# Patient Record
Sex: Female | Born: 1990 | Smoking: Never smoker
Health system: Southern US, Community
[De-identification: ages and names within clinical notes are randomized; demographics above are authoritative.]

---

## 2009-07-09 ENCOUNTER — Emergency Department: Payer: Self-pay | Admitting: Emergency Medicine

## 2016-03-08 ENCOUNTER — Ambulatory Visit: Payer: Self-pay | Attending: Internal Medicine

## 2016-06-16 ENCOUNTER — Emergency Department
Admission: EM | Admit: 2016-06-16 | Discharge: 2016-06-16 | Disposition: A | Discharge status: Home / Self Care | Payer: No Typology Code available for payment source | Attending: Emergency Medicine | Admitting: Emergency Medicine

## 2016-06-16 ENCOUNTER — Encounter: Payer: Self-pay | Admitting: Emergency Medicine

## 2016-06-16 DIAGNOSIS — S8002XA Contusion of left knee, initial encounter: Secondary | ICD-10-CM | POA: Insufficient documentation

## 2016-06-16 DIAGNOSIS — Y9389 Activity, other specified: Secondary | ICD-10-CM | POA: Insufficient documentation

## 2016-06-16 DIAGNOSIS — Y92481 Parking lot as the place of occurrence of the external cause: Secondary | ICD-10-CM | POA: Diagnosis not present

## 2016-06-16 DIAGNOSIS — S6992XA Unspecified injury of left wrist, hand and finger(s), initial encounter: Secondary | ICD-10-CM | POA: Diagnosis present

## 2016-06-16 DIAGNOSIS — S63502A Unspecified sprain of left wrist, initial encounter: Secondary | ICD-10-CM | POA: Diagnosis not present

## 2016-06-16 DIAGNOSIS — Y999 Unspecified external cause status: Secondary | ICD-10-CM | POA: Diagnosis not present

## 2016-06-16 MED ORDER — IBUPROFEN 800 MG PO TABS
800.0000 mg | ORAL_TABLET | Freq: Once | ORAL | Status: AC
  Administered 2016-06-16: 800 mg via ORAL
  Filled 2016-06-16: qty 1

## 2016-06-16 MED ORDER — NAPROXEN 500 MG PO TABS: 500.0000 mg | ORAL_TABLET | Freq: Two times a day (BID) | ORAL | 0 refills | Status: DC
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
## 2016-06-16 NOTE — Discharge Instructions (Signed)
Please take naproxen as needed and were Velcro wrist splint as needed. Ice wrist and hand as needed 20 minutes every hour for the next 2-3 days. Return to the ER for any worsening symptoms urgent changes in her health.

## 2020-08-19 ENCOUNTER — Encounter (HOSPITAL_COMMUNITY): Payer: Self-pay | Admitting: Emergency Medicine

## 2020-08-19 ENCOUNTER — Other Ambulatory Visit: Payer: Self-pay

## 2020-08-19 ENCOUNTER — Ambulatory Visit (HOSPITAL_COMMUNITY)
Admission: EM | Admit: 2020-08-19 | Discharge: 2020-08-19 | Disposition: A | Discharge status: Home / Self Care | Payer: Self-pay | Attending: Student | Admitting: Student

## 2020-08-19 ENCOUNTER — Ambulatory Visit (INDEPENDENT_AMBULATORY_CARE_PROVIDER_SITE_OTHER): Payer: Self-pay

## 2020-08-19 DIAGNOSIS — R509 Fever, unspecified: Secondary | ICD-10-CM

## 2020-08-19 DIAGNOSIS — Z20822 Contact with and (suspected) exposure to covid-19: Secondary | ICD-10-CM

## 2020-08-19 DIAGNOSIS — R059 Cough, unspecified: Secondary | ICD-10-CM

## 2020-08-19 DIAGNOSIS — J209 Acute bronchitis, unspecified: Secondary | ICD-10-CM

## 2020-08-19 DIAGNOSIS — R0682 Tachypnea, not elsewhere classified: Secondary | ICD-10-CM

## 2020-08-19 DIAGNOSIS — R000 Tachycardia, unspecified: Secondary | ICD-10-CM

## 2020-08-19 MED ORDER — BENZONATATE 100 MG PO CAPS: 100.0000 mg | ORAL_CAPSULE | Freq: Three times a day (TID) | ORAL | 0 refills | Status: DC

## 2020-08-19 MED ORDER — PREDNISONE 20 MG PO TABS: 20.0000 mg | ORAL_TABLET | Freq: Every day | ORAL | 0 refills | Status: AC

## 2020-08-19 MED ORDER — ACETAMINOPHEN 325 MG PO TABS
650.0000 mg | ORAL_TABLET | Freq: Once | ORAL | Status: AC
  Administered 2020-08-19: 650 mg via ORAL

## 2020-08-19 MED ORDER — PROMETHAZINE-DM 6.25-15 MG/5ML PO SYRP: 5.0000 mL | ORAL_SOLUTION | Freq: Four times a day (QID) | ORAL | 0 refills | Status: DC | PRN

## 2020-08-19 MED ORDER — PREDNISONE 20 MG PO TABS: 20.0000 mg | ORAL_TABLET | Freq: Every day | ORAL | 0 refills | Status: DC

## 2020-08-19 MED ORDER — PREDNISONE 20 MG PO TABS: 40.0000 mg | ORAL_TABLET | Freq: Every day | ORAL | 0 refills | Status: DC

## 2020-08-19 MED ORDER — ACETAMINOPHEN 325 MG PO TABS
ORAL_TABLET | ORAL | Status: AC
  Filled 2020-08-19: qty 2

## 2020-08-19 NOTE — ED Provider Notes (Signed)
[facility]    CSN: [id number] Arrival date & time: 08/19/20  1407      History   Chief Complaint Chief Complaint  Patient presents with  . Cough    HPI Crystal Hicks is a 30 y.o. female Presenting for URI symptoms for 9 days, following positive exposure to covid at home. States she had negative covid test on day 1 of symptoms. Lives with someone who tested positive for covid. Endorses frequent cough. Initially with diarrhea but no longer. Denies fevers/chills, n/v/d, shortness of breath, chest pain, facial pain, teeth pain, headaches, sore throat, loss of taste/smell, swollen lymph nodes, ear pain. Denies chest pain, shortness of breath, confusion, high fevers.    HPI  History reviewed. No pertinent past medical history.  There are no problems to display for this patient.   History reviewed. No pertinent surgical history.  OB History   No obstetric history on file.      Home Medications    Prior to Admission medications   Medication Sig Start Date End Date Taking? Authorizing Provider  benzonatate (TESSALON) 100 MG capsule Take 1 capsule (100 mg total) by mouth every 8 (eight) hours. 08/19/20  Yes Rhys Martini, PA-C  promethazine-dextromethorphan (PROMETHAZINE-DM) 6.25-15 MG/5ML syrup Take 5 mLs by mouth 4 (four) times daily as needed for cough. 08/19/20  Yes Rhys Martini, PA-C  naproxen (NAPROSYN) 500 MG tablet Take 1 tablet (500 mg total) by mouth 2 (two) times daily with a meal. 06/16/16   Evon Slack, PA-C  predniSONE (DELTASONE) 20 MG tablet Take 1 tablet (20 mg total) by mouth daily for 5 days. 08/19/20 08/24/20  Rhys Martini, PA-C    Family History History reviewed. No pertinent family history.  Social History Social History   Tobacco Use  . Smoking status: Never Smoker  . Smokeless tobacco: Never Used  Substance Use Topics  . Alcohol use: No     Allergies   Patient has no known allergies.   Review of Systems Review of  Systems  Constitutional: Negative for appetite change, chills and fever.  HENT: Negative for congestion, ear pain, rhinorrhea, sinus pressure, sinus pain and sore throat.   Eyes: Negative for redness and visual disturbance.  Respiratory: Positive for cough. Negative for chest tightness, shortness of breath and wheezing.   Cardiovascular: Negative for chest pain and palpitations.  Gastrointestinal: Negative for abdominal pain, constipation, diarrhea, nausea and vomiting.  Genitourinary: Negative for dysuria, frequency and urgency.  Musculoskeletal: Negative for myalgias.  Neurological: Negative for dizziness, weakness and headaches.  Psychiatric/Behavioral: Negative for confusion.  All other systems reviewed and are negative.    Physical Exam Triage Vital Signs ED Triage Vitals  Enc Vitals Group     BP      Pulse      Resp      Temp      Temp src      SpO2      Weight      Height      Head Circumference      Peak Flow      Pain Score      Pain Loc      Pain Edu?      Excl. in GC?    No data found.  Updated Vital Signs BP 135/75 (BP Location: Right Wrist)   Pulse (!) 107   Temp (!) 101.3 F (38.5 C) (Oral)   Resp (!) 24   LMP 07/28/2020   SpO2  100%   Visual Acuity Right Eye Distance:   Left Eye Distance:   Bilateral Distance:    Right Eye Near:   Left Eye Near:    Bilateral Near:     Physical Exam Vitals reviewed.  Constitutional:      General: She is not in acute distress.    Appearance: Normal appearance. She is not ill-appearing.  HENT:     Head: Normocephalic and atraumatic.     Right Ear: Hearing, tympanic membrane, ear canal and external ear normal. No swelling or tenderness. There is no impacted cerumen. No mastoid tenderness. Tympanic membrane is not perforated, erythematous, retracted or bulging.     Left Ear: Hearing, tympanic membrane, ear canal and external ear normal. No swelling or tenderness. There is no impacted cerumen. No mastoid  tenderness. Tympanic membrane is not perforated, erythematous, retracted or bulging.     Nose:     Right Sinus: No maxillary sinus tenderness or frontal sinus tenderness.     Left Sinus: No maxillary sinus tenderness or frontal sinus tenderness.     Mouth/Throat:     Mouth: Mucous membranes are moist.     Pharynx: Uvula midline. No oropharyngeal exudate or posterior oropharyngeal erythema.     Tonsils: No tonsillar exudate.  Cardiovascular:     Rate and Rhythm: Normal rate and regular rhythm.     Heart sounds: Normal heart sounds.  Pulmonary:     Breath sounds: Normal air entry. Decreased breath sounds present. No wheezing, rhonchi or rales.     Comments: Frequently coughing  Chest:     Chest wall: No tenderness.  Abdominal:     General: Abdomen is flat. Bowel sounds are normal.     Tenderness: There is no abdominal tenderness. There is no guarding or rebound. Negative signs include Murphy's sign, Rovsing's sign and McBurney's sign.  Lymphadenopathy:     Cervical: No cervical adenopathy.  Neurological:     General: No focal deficit present.     Mental Status: She is alert and oriented to person, place, and time.  Psychiatric:        Attention and Perception: Attention and perception normal.        Mood and Affect: Mood and affect normal.        Behavior: Behavior normal. Behavior is cooperative.        Thought Content: Thought content normal.        Judgment: Judgment normal.      UC Treatments / Results  Labs (all labs ordered are listed, but only abnormal results are displayed) Labs Reviewed - No data to display  EKG   Radiology DG Chest 2 View  Result Date: 08/19/2020 CLINICAL DATA:  Cough, fever, tachycardia, tachypnea, COVID-19 exposure EXAM: CHEST - 2 VIEW COMPARISON:  None. FINDINGS: Frontal and lateral views of the chest demonstrate an unremarkable cardiac silhouette. Lung volumes are diminished with crowding of the central vasculature. No airspace disease,  effusion, or pneumothorax. No acute bony abnormalities. IMPRESSION: 1. Low lung volumes.  No acute airspace disease. Electronically Signed   By: Sharlet Salina M.D.   On: 08/19/2020 15:34    Procedures Procedures (including critical care time)  Medications Ordered in UC Medications  acetaminophen (TYLENOL) tablet 650 mg (650 mg Oral Given 08/19/20 1454)    Initial Impression / Assessment and Plan / UC Course  I have reviewed the triage vital signs and the nursing notes.  Pertinent labs & imaging results that were available during my care of  the patient were reviewed by me and considered in my medical decision making (see chart for details).     Discussed that given positive exposure to covid19 and her covid-like symptoms, she most likely has covid. Since today is day 9 of symptoms, pt declines additional covid testing today.   CXR with 1. Low lung volumes.  No acute airspace disease.  Plan to treat for bronchitis as below.   Final Clinical Impressions(s) / UC Diagnoses   Final diagnoses:  Exposure to confirmed case of COVID-19  Acute bronchitis, unspecified organism  Suspected COVID-19 virus infection     Discharge Instructions     -Prednisone 20mg  (1 pill) daily for 5 days -Tessalon as needed for cough. Take one pill up to 3x daily (every 8 hours) -Promethazine DM cough syrup for congestion/cough. This could make you drowsy, so take at night before bed. -For fevers/chills, body aches, headaches- use Tylenol and Ibuprofen. You can alternate these for maximum effect. Use up to 3000mg  Tylenol daily and 3200mg  Ibuprofen daily. Make sure to take ibuprofen with food. Check the bottle of ibuprofen/tylenol for specific dosage instructions.     ED Prescriptions    Medication Sig Dispense Auth. Provider   predniSONE (DELTASONE) 20 MG tablet  (Status: Discontinued) Take 2 tablets (40 mg total) by mouth daily for 5 days. 10 tablet , PA-C   benzonatate (TESSALON) 100  MG capsule Take 1 capsule (100 mg total) by mouth every 8 (eight) hours. 21 capsule , PA-C   promethazine-dextromethorphan (PROMETHAZINE-DM) 6.25-15 MG/5ML syrup Take 5 mLs by mouth 4 (four) times daily as needed for cough. 118 mL Rhys Martini, PA-C   predniSONE (DELTASONE) 20 MG tablet  (Status: Discontinued) Take 1 tablet (20 mg total) by mouth daily for 5 days. 5 tablet Rhys Martini, PA-C   predniSONE (DELTASONE) 20 MG tablet Take 1 tablet (20 mg total) by mouth daily for 5 days. 5 tablet [birth year]1990, PA-C     PDMP not reviewed this encounter.   Rhys Martini, PA-C 08/19/20 [phone]

## 2020-08-19 NOTE — Discharge Instructions (Addendum)
-  Prednisone 20mg  (1 pill) daily for 5 days -Tessalon as needed for cough. Take one pill up to 3x daily (every 8 hours) -Promethazine DM cough syrup for congestion/cough. This could make you drowsy, so take at night before bed. -For fevers/chills, body aches, headaches- use Tylenol and Ibuprofen. You can alternate these for maximum effect. Use up to 3000mg  Tylenol daily and 3200mg  Ibuprofen daily. Make sure to take ibuprofen with food. Check the bottle of ibuprofen/tylenol for specific dosage instructions.

## 2020-08-19 NOTE — ED Triage Notes (Signed)
Pt presents with cough and SOB xs 1 week. States was tested for COVID last week and tested negative.

## 2020-09-01 ENCOUNTER — Other Ambulatory Visit: Payer: Self-pay

## 2020-09-01 ENCOUNTER — Observation Stay
Admission: EM | Admit: 2020-09-01 | Discharge: 2020-09-03 | Disposition: A | Discharge status: Home / Self Care | Payer: Self-pay | Attending: Internal Medicine | Admitting: Internal Medicine

## 2020-09-01 ENCOUNTER — Emergency Department: Payer: Self-pay

## 2020-09-01 DIAGNOSIS — R079 Chest pain, unspecified: Secondary | ICD-10-CM

## 2020-09-01 DIAGNOSIS — Z79899 Other long term (current) drug therapy: Secondary | ICD-10-CM | POA: Insufficient documentation

## 2020-09-01 DIAGNOSIS — I824Y3 Acute embolism and thrombosis of unspecified deep veins of proximal lower extremity, bilateral: Principal | ICD-10-CM | POA: Insufficient documentation

## 2020-09-01 DIAGNOSIS — Z20822 Contact with and (suspected) exposure to covid-19: Secondary | ICD-10-CM | POA: Insufficient documentation

## 2020-09-01 DIAGNOSIS — I824Y1 Acute embolism and thrombosis of unspecified deep veins of right proximal lower extremity: Secondary | ICD-10-CM

## 2020-09-01 DIAGNOSIS — I2699 Other pulmonary embolism without acute cor pulmonale: Secondary | ICD-10-CM

## 2020-09-01 LAB — BASIC METABOLIC PANEL
Anion gap: 7 (ref 5–15)
BUN: 15 mg/dL (ref 6–20)
CO2: 25 mmol/L (ref 22–32)
Calcium: 9.1 mg/dL (ref 8.9–10.3)
Chloride: 107 mmol/L (ref 98–111)
Creatinine, Ser: 0.85 mg/dL (ref 0.44–1.00)
GFR, Estimated: >60 mL/min (ref >60)
Glucose, Bld: 95 mg/dL (ref 70–99)
Potassium: 3.9 mmol/L (ref 3.5–5.1)
Sodium: 139 mmol/L (ref 135–145)

## 2020-09-01 LAB — CBC WITH DIFFERENTIAL/PLATELET
Abs Immature Granulocytes: 0.04 10*3/uL (ref 0.00–0.07)
Basophils Absolute: 0 10*3/uL (ref 0.0–0.1)
Basophils Relative: 0 %
Eosinophils Absolute: 0 10*3/uL (ref 0.0–0.5)
Eosinophils Relative: 0 %
HCT: 37.6 % (ref 36.0–46.0)
Hemoglobin: 12.6 g/dL (ref 12.0–15.0)
Immature Granulocytes: 0 %
Lymphocytes Relative: 18 %
Lymphs Abs: 1.8 10*3/uL (ref 0.7–4.0)
MCH: 28.5 pg (ref 26.0–34.0)
MCHC: 33.5 g/dL (ref 30.0–36.0)
MCV: 85.1 fL (ref 80.0–100.0)
Monocytes Absolute: 0.7 10*3/uL (ref 0.1–1.0)
Monocytes Relative: 7 %
Neutro Abs: 7.4 10*3/uL (ref 1.7–7.7)
Neutrophils Relative %: 75 %
Platelets: 240 10*3/uL (ref 150–400)
RBC: 4.42 MIL/uL (ref 3.87–5.11)
RDW: 13 % (ref 11.5–15.5)
WBC: 9.9 10*3/uL (ref 4.0–10.5)
nRBC: 0 % (ref 0.0–0.2)

## 2020-09-01 LAB — POC URINE PREG, ED: Preg Test, Ur: NEGATIVE

## 2020-09-01 MED ORDER — SODIUM CHLORIDE 0.9 % IV BOLUS
500.0000 mL | Freq: Once | INTRAVENOUS | Status: AC
  Administered 2020-09-01: 500 mL via INTRAVENOUS

## 2020-09-01 MED ORDER — IOHEXOL 350 MG/ML SOLN
100.0000 mL | Freq: Once | INTRAVENOUS | Status: AC | PRN
  Administered 2020-09-01: 100 mL via INTRAVENOUS

## 2020-09-01 NOTE — ED Notes (Signed)
Pt refusing covid swab, Sung MD made aware.

## 2020-09-01 NOTE — ED Notes (Signed)
Pt refuses wheelchair at this time

## 2020-09-01 NOTE — ED Provider Notes (Incomplete)
[facility] Emergency Department Provider Note   ____________________________________________   Event Date/Time   First MD Initiated Contact with Patient 09/01/20 2306     (approximate)  I have reviewed the triage vital signs and the nursing notes.   HISTORY  Chief Complaint Cough and Leg Pain    HPI Crystal Hicks is a 30 y.o. female referred to the ED from [facility] clinic for evaluation of right calf pain and cough.  Patient reports right calf pain x1 week.  Denies injury, travel, smoking, OCP use.  Prior to that patient has had dry cough x3 weeks.  She had a negative Covid test and placed on azithromycin, cough medicine and albuterol inhaler.  Patient believes that her calf pain was caused by her medications.  Has also had intermittent chest pain throughout this time. Denies fever, shortness of breath, abdominal pain, nausea, vomiting or dizziness.  Patient is not vaccinated against COVID-19.     Past medical history None  There are no problems to display for this patient.   No past surgical history on file.  Prior to Admission medications   Medication Sig Start Date End Date Taking? Authorizing Provider  benzonatate (TESSALON) 100 MG capsule Take 1 capsule (100 mg total) by mouth every 8 (eight) hours. 08/19/20   Rhys Martini, PA-C  naproxen (NAPROSYN) 500 MG tablet Take 1 tablet (500 mg total) by mouth 2 (two) times daily with a meal. 06/16/16   Evon Slack, PA-C  promethazine-dextromethorphan (PROMETHAZINE-DM) 6.25-15 MG/5ML syrup Take 5 mLs by mouth 4 (four) times daily as needed for cough. 08/19/20   Rhys Martini, PA-C    Allergies Patient has no known allergies.  No family history on file.  Social History Social History   Tobacco Use  . Smoking status: Never Smoker  . Smokeless tobacco: Never Used  Substance Use Topics  . Alcohol use: No    Review of Systems  Constitutional: No fever/chills Eyes: No visual  changes. ENT: No sore throat. Cardiovascular: Positive for chest pain. Respiratory: Positive for cough.  Denies shortness of breath. Gastrointestinal: No abdominal pain.  No nausea, no vomiting.  No diarrhea.  No constipation. Genitourinary: Negative for dysuria. Musculoskeletal: Positive for right calf pain.  Negative for back pain. Skin: Negative for rash. Neurological: Negative for headaches, focal weakness or numbness.   ____________________________________________   PHYSICAL EXAM:  VITAL SIGNS: ED Triage Vitals  Enc Vitals Group     BP 09/01/20 1929 (!) 164/85     Pulse Rate 09/01/20 1929 100     Resp 09/01/20 1929 20     Temp 09/01/20 1929 98.3 F (36.8 C)     Temp Source 09/01/20 1929 Oral     SpO2 09/01/20 1929 100 %     Weight 09/01/20 1930 (!) 370 lb (167.8 kg)     Height 09/01/20 1930 5\' 5"  (1.651 m)     Head Circumference --      Peak Flow --      Pain Score 09/01/20 1930 6     Pain Loc --      Pain Edu? --      Excl. in GC? --     Constitutional: Alert and oriented. Well appearing and in no acute distress. Eyes: Conjunctivae are normal. PERRL. EOMI. Head: Atraumatic. Nose: No congestion/rhinnorhea. Mouth/Throat: Mucous membranes are moist.   Neck: No stridor.   Cardiovascular: Normal rate, regular rhythm. Grossly normal heart sounds.  Good peripheral circulation. Respiratory: Dry  cough noted.  Normal respiratory effort.  No retractions. Lungs CTAB. Gastrointestinal: Obese.  Soft and nontender. No distention. No abdominal bruits. No CVA tenderness. Musculoskeletal: Right calf tenderness to palpation.  No pedal edema.  2+ distal pulses.  Brisk, less than 5-second capillary refill.  No joint effusions. Neurologic:  Normal speech and language. No gross focal neurologic deficits are appreciated.  Skin:  Skin is warm, dry and intact. No rash noted. Psychiatric: Mood and affect are normal. Speech and behavior are  normal.  ____________________________________________   LABS (all labs ordered are listed, but only abnormal results are displayed)  Labs Reviewed  SARS CORONAVIRUS 2 BY RT PCR (HOSPITAL ORDER, PERFORMED IN [place] [facility])  CBC WITH DIFFERENTIAL/PLATELET  BASIC METABOLIC PANEL  HCG, QUANTITATIVE, PREGNANCY  POC URINE PREG, ED  TROPONIN I (HIGH SENSITIVITY)   ____________________________________________  EKG  ED ECG REPORT I, SUNG,JADE J, the attending physician, personally viewed and interpreted this ECG.   Date: 09/01/2020  EKG Time: 2329  Rate: 104  Rhythm: sinus tachycardia  Axis: Normal  Intervals:none  ST&T Change: Nonspecific  ____________________________________________  RADIOLOGY I, SUNG,JADE J, personally viewed and evaluated these images (plain radiographs) as part of my medical decision making, as well as reviewing the written report by the radiologist.  ED MD interpretation: Right DVT;  Official radiology report(s): US Venous Img Lower Unilateral Right  Addendum Date: 09/01/2020   ADDENDUM REPORT: 09/01/2020 20:38 ADDENDUM: These results were called by telephone at the time of interpretation on 09/01/2020 at 8:38 pm to provider Shaune Pollack , who verbally acknowledged these results. Electronically Signed   By: Kreg Shropshire M.D.   On: 09/01/2020 20:38   Result Date: 09/01/2020 CLINICAL DATA:  Right calf pain for 1 week EXAM: RIGHT LOWER EXTREMITY VENOUS DOPPLER ULTRASOUND TECHNIQUE: Gray-scale sonography with compression, as well as color and duplex ultrasound, were performed to evaluate the deep venous system(s) from the level of the common femoral vein through the popliteal and proximal calf veins. COMPARISON:  None. FINDINGS: VENOUS Incompletely occlusive, noncompressible thrombus is seen in the popliteal and 1 of 2 posterior tibial veins. Occlusive, noncompressible thrombus is present in in both peroneal veins of the right lower extremity. Normal  compressibility of the common femoral, and superficial femoral veins. Visualized portions of profunda femoral vein and great saphenous vein unremarkable. No other filling defects to suggest additional DVT on grayscale or color Doppler imaging. Doppler waveforms show normal direction of venous flow, normal respiratory plasticity and response to augmentation. Limited views of the contralateral common femoral vein are unremarkable. OTHER None. Limitations: none IMPRESSION: Partially occlusive thrombus in the popliteal vein and a single posterior tibial vein as well as occlusive thrombus in both peroneal veins. Electronically Signed: By: Kreg Shropshire M.D. On: 09/01/2020 20:33    ____________________________________________   PROCEDURES  Procedure(s) performed (including Critical Care):  .1-3 Lead EKG Interpretation Performed by: Irean Hong, MD Authorized by: Irean Hong, MD     Interpretation: abnormal     ECG rate:  106   ECG rate assessment: tachycardic     Rhythm: sinus tachycardia     Ectopy: none     Conduction: normal   Comments:     Patient placed on cardiac monitor to evaluate for arrhythmias     ____________________________________________   INITIAL IMPRESSION / ASSESSMENT AND PLAN / ED COURSE  As part of my medical decision making, I reviewed the following data within the electronic MEDICAL RECORD NUMBER Nursing notes reviewed  and incorporated, Labs reviewed, EKG interpreted, Old chart reviewed, Radiograph reviewed and Notes from prior ED visits     30 year old female sent from [facility] clinic for evaluation of right calf pain and cough with chest pain. Differential diagnosis includes, but is not limited to, ACS, aortic dissection, pulmonary embolism, cardiac tamponade, pneumothorax, pneumonia, pericarditis, myocarditis, GI-related causes including esophagitis/gastritis, and musculoskeletal chest wall pain.    Right lower leg ultrasound is positive for DVT.  Will check EKG,  troponin, CTA chest to evaluate for PE.  Obtain Covid swab.      ____________________________________________   FINAL CLINICAL IMPRESSION(S) / ED DIAGNOSES  Final diagnoses:  Acute deep vein thrombosis (DVT) of proximal vein of right lower extremity [facility])     ED Discharge Orders    None      *Please note:  Crystal Hicks was evaluated in Emergency Department on 09/01/2020 for the symptoms described in the history of present illness. She was evaluated in the context of the global COVID-19 pandemic, which necessitated consideration that the patient might be at risk for infection with the SARS-CoV-2 virus that causes COVID-19. Institutional protocols and algorithms that pertain to the evaluation of patients at risk for COVID-19 are in a state of rapid change based on information released by regulatory bodies including the CDC and federal and state organizations. These policies and algorithms were followed during the patient's care in the ED.  Some ED evaluations and interventions may be delayed as a result of limited staffing during and the pandemic.*   Note:  This document was prepared using Dragon voice recognition software and may include unintentional dictation errors.

## 2020-09-01 NOTE — ED Provider Notes (Signed)
[facility] Emergency Department Provider Note   ____________________________________________   Event Date/Time   First MD Initiated Contact with Patient 09/01/20 2306     (approximate)  I have reviewed the triage vital signs and the nursing notes.   HISTORY  Chief Complaint Cough and Leg Pain    HPI Crystal Hicks is a 30 y.o. female referred to the ED from [facility] clinic for evaluation of right calf pain and cough.  Patient reports right calf pain x1 week.  Denies injury, travel, smoking, OCP use.  Prior to that patient has had dry cough x3 weeks.  She had a negative Covid test and placed on azithromycin, cough medicine and albuterol inhaler.  Patient believes that her calf pain was caused by her medications.  Has also had intermittent chest pain throughout this time. Denies fever, shortness of breath, abdominal pain, nausea, vomiting or dizziness.  Patient is not vaccinated against COVID-19.     Past medical history None  There are no problems to display for this patient.   No past surgical history on file.  Prior to Admission medications   Medication Sig Start Date End Date Taking? Authorizing Provider  benzonatate (TESSALON) 100 MG capsule Take 1 capsule (100 mg total) by mouth every 8 (eight) hours. 08/19/20   Rhys Martini, PA-C  naproxen (NAPROSYN) 500 MG tablet Take 1 tablet (500 mg total) by mouth 2 (two) times daily with a meal. 06/16/16   Evon Slack, PA-C  promethazine-dextromethorphan (PROMETHAZINE-DM) 6.25-15 MG/5ML syrup Take 5 mLs by mouth 4 (four) times daily as needed for cough. 08/19/20   Rhys Martini, PA-C    Allergies Patient has no known allergies.  No family history on file.  Social History Social History   Tobacco Use  . Smoking status: Never Smoker  . Smokeless tobacco: Never Used  Substance Use Topics  . Alcohol use: No    Review of Systems  Constitutional: No fever/chills Eyes: No visual  changes. ENT: No sore throat. Cardiovascular: Positive for chest pain. Respiratory: Positive for cough.  Denies shortness of breath. Gastrointestinal: No abdominal pain.  No nausea, no vomiting.  No diarrhea.  No constipation. Genitourinary: Negative for dysuria. Musculoskeletal: Positive for right calf pain.  Negative for back pain. Skin: Negative for rash. Neurological: Negative for headaches, focal weakness or numbness.   ____________________________________________   PHYSICAL EXAM:  VITAL SIGNS: ED Triage Vitals  Enc Vitals Group     BP 09/01/20 1929 (!) 164/85     Pulse Rate 09/01/20 1929 100     Resp 09/01/20 1929 20     Temp 09/01/20 1929 98.3 F (36.8 C)     Temp Source 09/01/20 1929 Oral     SpO2 09/01/20 1929 100 %     Weight 09/01/20 1930 (!) 370 lb (167.8 kg)     Height 09/01/20 1930 5\' 5"  (1.651 m)     Head Circumference --      Peak Flow --      Pain Score 09/01/20 1930 6     Pain Loc --      Pain Edu? --      Excl. in GC? --     Constitutional: Alert and oriented. Well appearing and in no acute distress. Eyes: Conjunctivae are normal. PERRL. EOMI. Head: Atraumatic. Nose: No congestion/rhinnorhea. Mouth/Throat: Mucous membranes are moist.   Neck: No stridor.   Cardiovascular: Normal rate, regular rhythm. Grossly normal heart sounds.  Good peripheral circulation. Respiratory: Dry  cough noted.  Normal respiratory effort.  No retractions. Lungs CTAB. Gastrointestinal: Obese.  Soft and nontender. No distention. No abdominal bruits. No CVA tenderness. Musculoskeletal: Right calf tenderness to palpation.  No pedal edema.  2+ distal pulses.  Brisk, less than 5-second capillary refill.  No joint effusions. Neurologic:  Normal speech and language. No gross focal neurologic deficits are appreciated.  Skin:  Skin is warm, dry and intact. No rash noted. Psychiatric: Mood and affect are normal. Speech and behavior are  normal.  ____________________________________________   LABS (all labs ordered are listed, but only abnormal results are displayed)  Labs Reviewed  SARS CORONAVIRUS 2 BY RT PCR (HOSPITAL ORDER, PERFORMED IN [place] [facility])  CBC WITH DIFFERENTIAL/PLATELET  BASIC METABOLIC PANEL  POC URINE PREG, ED  TROPONIN I (HIGH SENSITIVITY)   ____________________________________________  EKG  ED ECG REPORT I, Kieran Nachtigal J, the attending physician, personally viewed and interpreted this ECG.   Date: 09/01/2020  EKG Time: 2329  Rate: 104  Rhythm: sinus tachycardia  Axis: Normal  Intervals:none  ST&T Change: Nonspecific  ____________________________________________  RADIOLOGY I, Berish Bohman J, personally viewed and evaluated these images (plain radiographs) as part of my medical decision making, as well as reviewing the written report by the radiologist.  ED MD interpretation: Right DVT; pulmonary embolism  Official radiology report(s): CT Angio Chest PE W/Cm &/Or Wo Cm  Result Date: 09/02/2020 CLINICAL DATA:  PE suspected, high probability, positive DVT, chest pain and cough for 3 weeks EXAM: CT ANGIOGRAPHY CHEST WITH CONTRAST TECHNIQUE: Multidetector CT imaging of the chest was performed using the standard protocol during bolus administration of intravenous contrast. Multiplanar CT image reconstructions and MIPs were obtained to evaluate the vascular anatomy. CONTRAST:  OMNIPAQUE IOHEXOL 350 MG/ML SOLN COMPARISON:  Radiograph 08/19/2020 FINDINGS: Cardiovascular: Initial contrast opacification of the pulmonary arteries with suboptimal requiring repeat injection. Repeat contrast bolus opacification is borderline with imaging quality significantly degraded by patient body habitus. However, there are demonstrable distal right central, right upper, middle and lower lobar and smaller segmental filling defects throughout the right lung (18/71, 67, 78). Question artifact versus additional  filling defect in segmental branches of the left lower lobe (18/72). Central pulmonary arteries are normal caliber. RV/LV ratio is maintained at 0.55. Normal heart size. No pericardial effusion. No pericardial effusion. Mediastinum/Nodes: Some soft tissue attenuation in the anterior mediastinum is favored to reflect remnant in the absence trauma. No mediastinal fluid or gas. Normal thyroid gland and thoracic inlet. No acute abnormality of the trachea or esophagus. No worrisome mediastinal, hilar or axillary adenopathy. Lungs/Pleura: Minimal atelectasis. No consolidation, features of edema, pneumothorax, or effusion. No suspicious pulmonary nodules or masses. Upper Abdomen: Diffuse hepatic hypoattenuation compatible with hepatic steatosis. No acute abnormalities present in the visualized portions of the upper abdomen. Musculoskeletal: Mildly age advanced degenerative changes in the spine. No acute osseous abnormality or suspicious osseous lesion. No acute or conspicuous chest wall lesions. Review of the MIP images confirms the above findings. IMPRESSION: 1. Exam quality degraded by borderline opacification and patient body habitus. 2. Demonstrable distal right central, right upper, middle and lower lobar and smaller segmental filling defects throughout the right lung. Question artifact versus additional filling defect in segmental branches of the left lower lobe. No evidence of right heart strain. 3. Hepatic steatosis. These results were called by telephone at the time of interpretation on 09/02/2020 at 12:10 am to provider Posey Petrik , who verbally acknowledged these results. Electronically Signed   By: Samuella Cota  [facility] M.D.   On: 09/02/2020 00:10   US Venous Img Lower Unilateral Right  Addendum Date: 09/01/2020   ADDENDUM REPORT: 09/01/2020 20:38 ADDENDUM: These results were called by telephone at the time of interpretation on 09/01/2020 at 8:38 pm to provider Shaune Pollack , who verbally acknowledged these results.  Electronically Signed   By: Kreg Shropshire M.D.   On: 09/01/2020 20:38   Result Date: 09/01/2020 CLINICAL DATA:  Right calf pain for 1 week EXAM: RIGHT LOWER EXTREMITY VENOUS DOPPLER ULTRASOUND TECHNIQUE: Gray-scale sonography with compression, as well as color and duplex ultrasound, were performed to evaluate the deep venous system(s) from the level of the common femoral vein through the popliteal and proximal calf veins. COMPARISON:  None. FINDINGS: VENOUS Incompletely occlusive, noncompressible thrombus is seen in the popliteal and 1 of 2 posterior tibial veins. Occlusive, noncompressible thrombus is present in in both peroneal veins of the right lower extremity. Normal compressibility of the common femoral, and superficial femoral veins. Visualized portions of profunda femoral vein and great saphenous vein unremarkable. No other filling defects to suggest additional DVT on grayscale or color Doppler imaging. Doppler waveforms show normal direction of venous flow, normal respiratory plasticity and response to augmentation. Limited views of the contralateral common femoral vein are unremarkable. OTHER None. Limitations: none IMPRESSION: Partially occlusive thrombus in the popliteal vein and a single posterior tibial vein as well as occlusive thrombus in both peroneal veins. Electronically Signed: By: Kreg Shropshire M.D. On: 09/01/2020 20:33    ____________________________________________   PROCEDURES  Procedure(s) performed (including Critical Care):  .1-3 Lead EKG Interpretation Performed by: Irean Hong, MD Authorized by: Irean Hong, MD     Interpretation: abnormal     ECG rate:  106   ECG rate assessment: tachycardic     Rhythm: sinus tachycardia     Ectopy: none     Conduction: normal   Comments:     Patient placed on cardiac monitor to evaluate for arrhythmias    CRITICAL CARE Performed by: Irean Hong   Total critical care time: 30 minutes  Critical care time was exclusive of  separately billable procedures and treating other patients.  Critical care was necessary to treat or prevent imminent or life-threatening deterioration.  Critical care was time spent personally by me on the following activities: development of treatment plan with patient and/or surrogate as well as nursing, discussions with consultants, evaluation of patient's response to treatment, examination of patient, obtaining history from patient or surrogate, ordering and performing treatments and interventions, ordering and review of laboratory studies, ordering and review of radiographic studies, pulse oximetry and re-evaluation of patient's condition.  ____________________________________________   INITIAL IMPRESSION / ASSESSMENT AND PLAN / ED COURSE  As part of my medical decision making, I reviewed the following data within the electronic MEDICAL RECORD NUMBER Nursing notes reviewed and incorporated, Labs reviewed, EKG interpreted, Old chart reviewed, Radiograph reviewed and Notes from prior ED visits     30 year old female sent from [facility] clinic for evaluation of right calf pain and cough with chest pain. Differential diagnosis includes, but is not limited to, ACS, aortic dissection, pulmonary embolism, cardiac tamponade, pneumothorax, pneumonia, pericarditis, myocarditis, GI-related causes including esophagitis/gastritis, and musculoskeletal chest wall pain.    Right lower leg ultrasound is positive for DVT.  Will check EKG, troponin, CTA chest to evaluate for PE.  Obtain Covid swab.  Clinical Course as of 09/02/20 0017  Mon Sep 01, 2020  2320 Spoke with radiology regarding patient's  CT scan positive for pulmonary embolism.  Updated patient who is agreeable with hospitalization.  Will initiate heparin bolus with drip and discussed case with hospitalist services for admission.  Of note, patient refused COVID-19 swab. [JS]    Clinical Course User Index [JS] Irean Hong, MD      ____________________________________________   FINAL CLINICAL IMPRESSION(S) / ED DIAGNOSES  Final diagnoses:  Acute deep vein thrombosis (DVT) of proximal vein of right lower extremity (HCC)  Other acute pulmonary embolism, unspecified whether acute cor pulmonale present (HCC)  Chest pain, unspecified type     ED Discharge Orders    None      *Please note:  JAMMY STLOUIS was evaluated in Emergency Department on 09/02/2020 for the symptoms described in the history of present illness. She was evaluated in the context of the global COVID-19 pandemic, which necessitated consideration that the patient might be at risk for infection with the SARS-CoV-2 virus that causes COVID-19. Institutional protocols and algorithms that pertain to the evaluation of patients at risk for COVID-19 are in a state of rapid change based on information released by regulatory bodies including the CDC and federal and state organizations. These policies and algorithms were followed during the patient's care in the ED.  Some ED evaluations and interventions may be delayed as a result of limited staffing during and the pandemic.*   Note:  This document was prepared using Dragon voice recognition software and may include unintentional dictation errors.   Irean Hong, MD 09/02/20 [phone]

## 2020-09-01 NOTE — ED Triage Notes (Signed)
Pt in with co cough states went to [facility] acute care and was sent here to rule out DVT due to also having right calf pain. Pt has had cough x 3 weeks, and right calf pain for 1 week.

## 2020-09-02 ENCOUNTER — Observation Stay (HOSPITAL_BASED_OUTPATIENT_CLINIC_OR_DEPARTMENT_OTHER)
Admit: 2020-09-02 | Discharge: 2020-09-02 | Disposition: A | Discharge status: Home / Self Care | Payer: Self-pay | Attending: Internal Medicine | Admitting: Internal Medicine

## 2020-09-02 ENCOUNTER — Encounter: Payer: Self-pay | Admitting: Internal Medicine

## 2020-09-02 DIAGNOSIS — I824Y1 Acute embolism and thrombosis of unspecified deep veins of right proximal lower extremity: Secondary | ICD-10-CM

## 2020-09-02 DIAGNOSIS — R079 Chest pain, unspecified: Secondary | ICD-10-CM

## 2020-09-02 DIAGNOSIS — I2699 Other pulmonary embolism without acute cor pulmonale: Secondary | ICD-10-CM

## 2020-09-02 LAB — ECHOCARDIOGRAM COMPLETE
Height: 65 in
S' Lateral: 2.26 cm
Weight: 5920 oz

## 2020-09-02 LAB — BASIC METABOLIC PANEL
Anion gap: 9 (ref 5–15)
BUN: 12 mg/dL (ref 6–20)
CO2: 25 mmol/L (ref 22–32)
Calcium: 8.8 mg/dL — ABNORMAL LOW (ref 8.9–10.3)
Chloride: 105 mmol/L (ref 98–111)
Creatinine, Ser: 0.81 mg/dL (ref 0.44–1.00)
GFR, Estimated: >60 mL/min (ref >60)
Glucose, Bld: 115 mg/dL — ABNORMAL HIGH (ref 70–99)
Potassium: 3.7 mmol/L (ref 3.5–5.1)
Sodium: 139 mmol/L (ref 135–145)

## 2020-09-02 LAB — HIV ANTIBODY (ROUTINE TESTING W REFLEX): HIV Screen 4th Generation wRfx: NONREACTIVE

## 2020-09-02 LAB — CBC
HCT: 36.1 % (ref 36.0–46.0)
Hemoglobin: 12.4 g/dL (ref 12.0–15.0)
MCH: 29.2 pg (ref 26.0–34.0)
MCHC: 34.3 g/dL (ref 30.0–36.0)
MCV: 85.1 fL (ref 80.0–100.0)
Platelets: 237 10*3/uL (ref 150–400)
RBC: 4.24 MIL/uL (ref 3.87–5.11)
RDW: 13.2 % (ref 11.5–15.5)
WBC: 9.7 10*3/uL (ref 4.0–10.5)
nRBC: 0 % (ref 0.0–0.2)

## 2020-09-02 LAB — PROTIME-INR
INR: 1.2 (ref 0.8–1.2)
Prothrombin Time: 14.9 seconds (ref 11.4–15.2)

## 2020-09-02 LAB — CBG MONITORING, ED: Glucose-Capillary: 123 mg/dL — ABNORMAL HIGH (ref 70–99)

## 2020-09-02 LAB — HEPARIN LEVEL (UNFRACTIONATED): Heparin Unfractionated: 0.39 IU/mL (ref 0.30–0.70)

## 2020-09-02 LAB — BRAIN NATRIURETIC PEPTIDE: B Natriuretic Peptide: 8 pg/mL (ref 0.0–100.0)

## 2020-09-02 LAB — SARS CORONAVIRUS 2 BY RT PCR (HOSPITAL ORDER, PERFORMED IN ~~LOC~~ HOSPITAL LAB): SARS Coronavirus 2: NEGATIVE

## 2020-09-02 LAB — HEMOGLOBIN A1C
Hgb A1c MFr Bld: 5.7 % — ABNORMAL HIGH (ref 4.8–5.6)
Mean Plasma Glucose: 116.89 mg/dL

## 2020-09-02 LAB — TROPONIN I (HIGH SENSITIVITY): Troponin I (High Sensitivity): 3 ng/L (ref <18)

## 2020-09-02 LAB — APTT: aPTT: 134 seconds — ABNORMAL HIGH (ref 24–36)

## 2020-09-02 LAB — TSH: TSH: 4.865 u[IU]/mL — ABNORMAL HIGH (ref 0.350–4.500)

## 2020-09-02 MED ORDER — APIXABAN 5 MG PO TABS
10.0000 mg | ORAL_TABLET | Freq: Two times a day (BID) | ORAL | Status: DC
  Administered 2020-09-02 – 2020-09-03 (×3): 10 mg via ORAL
  Filled 2020-09-02 (×3): qty 2

## 2020-09-02 MED ORDER — ONDANSETRON HCL 4 MG PO TABS: 4.0000 mg | ORAL_TABLET | Freq: Four times a day (QID) | ORAL | Status: DC | PRN

## 2020-09-02 MED ORDER — ACETAMINOPHEN 650 MG RE SUPP: 650.0000 mg | Freq: Four times a day (QID) | RECTAL | Status: DC | PRN

## 2020-09-02 MED ORDER — HEPARIN BOLUS VIA INFUSION
6000.0000 [IU] | INTRAVENOUS | Status: AC
  Administered 2020-09-02: 6000 [IU] via INTRAVENOUS
  Filled 2020-09-02: qty 6000

## 2020-09-02 MED ORDER — APIXABAN 5 MG PO TABS: 5.0000 mg | ORAL_TABLET | Freq: Two times a day (BID) | ORAL | Status: DC

## 2020-09-02 MED ORDER — HEPARIN (PORCINE) 25000 UT/250ML-% IV SOLN
1700.0000 [IU]/h | INTRAVENOUS | Status: DC
  Administered 2020-09-02: 1700 [IU]/h via INTRAVENOUS
  Filled 2020-09-02: qty 250

## 2020-09-02 MED ORDER — HEPARIN (PORCINE) 25000 UT/250ML-% IV SOLN: 14.0000 [IU]/kg/h | INTRAVENOUS | Status: DC

## 2020-09-02 MED ORDER — ONDANSETRON HCL 4 MG/2ML IJ SOLN: 4.0000 mg | Freq: Four times a day (QID) | INTRAMUSCULAR | Status: DC | PRN

## 2020-09-02 MED ORDER — HYDROCODONE-ACETAMINOPHEN 5-325 MG PO TABS
1.0000 | ORAL_TABLET | ORAL | Status: DC | PRN
  Filled 2020-09-02: qty 1

## 2020-09-02 MED ORDER — HEPARIN SODIUM (PORCINE) 5000 UNIT/ML IJ SOLN: 60.0000 [IU]/kg | Freq: Once | INTRAMUSCULAR | Status: DC

## 2020-09-02 MED ORDER — ALBUTEROL SULFATE (2.5 MG/3ML) 0.083% IN NEBU: 2.5000 mg | INHALATION_SOLUTION | RESPIRATORY_TRACT | Status: DC | PRN

## 2020-09-02 MED ORDER — BENZONATATE 100 MG PO CAPS
100.0000 mg | ORAL_CAPSULE | Freq: Three times a day (TID) | ORAL | Status: DC
  Administered 2020-09-02 – 2020-09-03 (×4): 100 mg via ORAL
  Filled 2020-09-02 (×4): qty 1

## 2020-09-02 MED ORDER — ACETAMINOPHEN 325 MG PO TABS
650.0000 mg | ORAL_TABLET | Freq: Four times a day (QID) | ORAL | Status: DC | PRN
  Administered 2020-09-02 (×2): 650 mg via ORAL
  Filled 2020-09-02 (×2): qty 2

## 2020-09-02 NOTE — Progress Notes (Signed)
Patient seen and examined, admitted earlier this morning by Dr. Maisie Fus, please see her H&P for details -Briefly this is a 30 year old morbidly obese female with a BMI of 32, has been getting weekly Covid tests as she is unvaccinated to work.,  She tested positive for COVID-19 on 1/18, at the time had URI, sore throat cough congestion body aches chills etc, ironically on the same day also had a negative test in addition to a positive covid test, eventually recovered -Presented to the ED yesterday with right calf pain and swelling, ongoing cough and congestion, in the emergency room she was diagnosed with right lower extremity DVT and bilateral pulmonary embolism.  Acute DVT/PE -Likely secondary to recent COVID-19 infection, no evidence of pneumonia, originally tested positive on 1/18, has completed 10 days of isolation. -Morbid obesity is also a risk factor -Follow-up echocardiogram, -Change IV heparin to Eliquis  Morbid obesity BMI 61.5 -Strongly recommended need for diet and lifestyle modification  Crystal Cove, MD

## 2020-09-02 NOTE — ED Notes (Signed)
Pt complaining of pain to right lower extremity, pt visibly uncomfortable and tachycardic in the 110s. Para March MD sent secure chat to make aware for possible need for stronger pain control.

## 2020-09-02 NOTE — Consult Note (Addendum)
ANTICOAGULATION CONSULT NOTE - Initial Consult  Pharmacy Consult for Eliquis initiation Indication: pulmonary embolus and DVT  No Known Allergies  Patient Measurements: Height: 5\' 5"  (165.1 cm) Weight: (!) 167.8 kg (370 lb) IBW/kg (Calculated) : 57 Heparin Dosing Weight: 100.2  Vital Signs: BP: 125/82 (02/08 1138) Pulse Rate: 100 (02/08 1138)  Labs: Recent Labs    09/01/20 2059 09/02/20 0035 09/02/20 0606  HGB 12.6  --  12.4  HCT 37.6  --  36.1  PLT 240  --  237  APTT  --  134*  --   LABPROT  --  14.9  --   INR  --  1.2  --   HEPARINUNFRC  --   --  0.39  CREATININE 0.85  --  0.81  TROPONINIHS 3  --   --     Estimated Creatinine Clearance: 160.9 mL/min (by C-G formula based on SCr of 0.81 mg/dL).   Medical History: No past medical history on file.  Medications:  Scheduled:  . apixaban  10 mg Oral BID   Followed by  . [START ON 09/09/2020] apixaban  5 mg Oral BID  . benzonatate  100 mg Oral Q8H    Assessment: Pt is a 30 y.o female admitted to ED for cough (3x/week) and right calf pain (1 week). Pt tested positive for COVID on 08/12/20. Right lower extremity venous doppler ultrasound found partial occlusive thrombus in the popliteal vein and a single posterior tibial vein as well as occlusive thrombus in both peroneal veins. CTA found demonstrable distal right central, right upper, middle and lower lobar and smaller segmental filling defects throughout the right lung.   Goal of Therapy:  Monitor CBCs every 72 hours while admitted per protocol Monitor platelets by anticoagulation protocol: Yes   Plan:  Initiate Apixaban 10mg  BID for 7 days (starting 09/02/20) and then transition to Apixiban 5mg  BID thereafter (starting 09/09/20)  10/31/20, PharmD Student  09/02/2020,11:47 AM

## 2020-09-02 NOTE — H&P (Signed)
History and Physical    Crystal Hicks TMH:[id number] DOB: [birth year] DOA: 09/01/2020  PCP: Patient, No Pcp Per  Patient coming from: referred from [facility]  I have personally briefly reviewed patient's old medical records in [facility]  Chief Complaint: cough and leg pain   HPI: Crystal Hicks is a 30 y.o. female with medical history significant of  With no significant medical history other than obesity who presents to ed in referral from [facility] due to continued cough and right calf pain and leg swelling with concern for DVT.  Patient has interim history of cough/uri symptoms over the last 3 weeks she was treated for bronchitis with zpack but appears to still have symptoms. She followed up with clinic due to continued cough and calf pain and swelling on the right x1 week and was referred to ed. In ed on evaluation patient was was found to have DVT and well PE. Patient is admitted for further treatment. Patient any OCP,long trips, tobacco abuse, prior history of clots, family history of clots, or recent trauma. On further ros she denies sob, n/v/d/abdominal pain. She notes she currently has no calf pain. She states she has chest pain only when she cough but not with deep breathing.  No current fever or chills.  ED Course:  Vitals: 98.3, bp 164/85, hr 100, rr 20 sat 100% on ra  Labs: Wbc :9.9, hbg 12.6,plt 240 Na:139, K3.9, gu 95 cr 0.85 ce:3 RLE U/s:Partially occlusive thrombus in the popliteal vein and a single posterior tibial vein as well as occlusive thrombus in both peroneal veins. CTPA: IMPRESSION: 1. Exam quality degraded by borderline opacification and patient body habitus. 2. Demonstrable distal right central, right upper, middle and lower lobar and smaller segmental filling defects throughout the right lung. Question artifact versus additional filling defect in segmental branches of the left lower lobe. No evidence of right heart strain. 3. Hepatic  steatosis.  Review of Systems: As per HPI otherwise 10 point review of systems negative.   No past medical history on file.  No past surgical history on file.   reports that she has never smoked. She has never used smokeless tobacco. She reports that she does not drink alcohol. No history on file for drug use.  No Known Allergies  No family history on file.  Prior to Admission medications   Medication Sig Start Date End Date Taking? Authorizing Provider  benzonatate (TESSALON) 100 MG capsule Take 1 capsule (100 mg total) by mouth every 8 (eight) hours. 08/19/20   Rhys Martini, PA-C  naproxen (NAPROSYN) 500 MG tablet Take 1 tablet (500 mg total) by mouth 2 (two) times daily with a meal. 06/16/16   Evon Slack, PA-C  promethazine-dextromethorphan (PROMETHAZINE-DM) 6.25-15 MG/5ML syrup Take 5 mLs by mouth 4 (four) times daily as needed for cough. 08/19/20   Rhys Martini, PA-C    Physical Exam: Vitals:   09/01/20 2254 09/01/20 2300 09/02/20 0030 09/02/20 0045  BP: (!) 158/111  (!) 151/96   Pulse: (!) 112 (!) 106 99 99  Resp: 16   (!) 26  Temp:      TempSrc:      SpO2: 99% 100% 97% 97%  Weight:      Height:         Vitals:   09/01/20 2254 09/01/20 2300 09/02/20 0030 09/02/20 0045  BP: (!) 158/111  (!) 151/96   Pulse: (!) 112 (!) 106 99 99  Resp: 16   (!)  26  Temp:      TempSrc:      SpO2: 99% 100% 97% 97%  Weight:      Height:      Constitutional: NAD, calm, comfortable Eyes: PERRL, lids and conjunctivae normal ENMT: Mucous membranes are moist. Posterior pharynx clear of any exudate or lesions.Normal dentition.  Neck: normal, supple, no masses, no thyromegaly Respiratory: clear to auscultation bilaterally, no wheezing, no crackles. Normal respiratory effort. No accessory muscle use.  Cardiovascular: Regular rate and rhythm, no murmurs / rubs / gallops. +swelling right leg . 2+ pedal pulses. No carotid bruits.  Abdomen: no tenderness, no masses palpated. No  hepatosplenomegaly. Bowel sounds positive.  Musculoskeletal: no clubbing / cyanosis. No joint deformity upper and lower extremities. Good ROM, no contractures. Normal muscle tone. Right leg swelling and some warmth compared to left  Skin: no rashes, lesions, ulcers. No induration Neurologic: CN 2-12 grossly intact. Sensation intact, Strength 5/5 in all 4.  Psychiatric: Normal judgment and insight. Alert and oriented x 3. Normal mood.    Labs on Admission: I have personally reviewed following labs and imaging studies  CBC: Recent Labs  Lab 09/01/20 2059  WBC 9.9  NEUTROABS 7.4  HGB 12.6  HCT 37.6  MCV 85.1  PLT 240   Basic Metabolic Panel: Recent Labs  Lab 09/01/20 2059  NA 139  K 3.9  CL 107  CO2 25  GLUCOSE 95  BUN 15  CREATININE 0.85  CALCIUM 9.1   GFR: Estimated Creatinine Clearance: 153.4 mL/min (by C-G formula based on SCr of 0.85 mg/dL). Liver Function Tests: No results for input(s): AST, ALT, ALKPHOS, BILITOT, PROT, ALBUMIN in the last 168 hours. No results for input(s): LIPASE, AMYLASE in the last 168 hours. No results for input(s): AMMONIA in the last 168 hours. Coagulation Profile: Recent Labs  Lab 09/02/20 0035  INR 1.2   Cardiac Enzymes: No results for input(s): CKTOTAL, CKMB, CKMBINDEX, TROPONINI in the last 168 hours. BNP (last 3 results) No results for input(s): PROBNP in the last 8760 hours. HbA1C: No results for input(s): HGBA1C in the last 72 hours. CBG: No results for input(s): GLUCAP in the last 168 hours. Lipid Profile: No results for input(s): CHOL, HDL, LDLCALC, TRIG, CHOLHDL, LDLDIRECT in the last 72 hours. Thyroid Function Tests: No results for input(s): TSH, T4TOTAL, FREET4, T3FREE, THYROIDAB in the last 72 hours. Anemia Panel: No results for input(s): VITAMINB12, FOLATE, FERRITIN, TIBC, IRON, RETICCTPCT in the last 72 hours. Urine analysis: No results found for: COLORURINE, APPEARANCEUR, LABSPEC, PHURINE, GLUCOSEU, HGBUR,  BILIRUBINUR, KETONESUR, PROTEINUR, UROBILINOGEN, NITRITE, LEUKOCYTESUR  Radiological Exams on Admission: CT Angio Chest PE W/Cm &/Or Wo Cm  Result Date: 09/02/2020 CLINICAL DATA:  PE suspected, high probability, positive DVT, chest pain and cough for 3 weeks EXAM: CT ANGIOGRAPHY CHEST WITH CONTRAST TECHNIQUE: Multidetector CT imaging of the chest was performed using the standard protocol during bolus administration of intravenous contrast. Multiplanar CT image reconstructions and MIPs were obtained to evaluate the vascular anatomy. CONTRAST:  OMNIPAQUE IOHEXOL 350 MG/ML SOLN COMPARISON:  Radiograph 08/19/2020 FINDINGS: Cardiovascular: Initial contrast opacification of the pulmonary arteries with suboptimal requiring repeat injection. Repeat contrast bolus opacification is borderline with imaging quality significantly degraded by patient body habitus. However, there are demonstrable distal right central, right upper, middle and lower lobar and smaller segmental filling defects throughout the right lung (18/71, 67, 78). Question artifact versus additional filling defect in segmental branches of the left lower lobe (18/72). Central pulmonary arteries are normal  caliber. RV/LV ratio is maintained at 0.55. Normal heart size. No pericardial effusion. No pericardial effusion. Mediastinum/Nodes: Some soft tissue attenuation in the anterior mediastinum is favored to reflect remnant in the absence trauma. No mediastinal fluid or gas. Normal thyroid gland and thoracic inlet. No acute abnormality of the trachea or esophagus. No worrisome mediastinal, hilar or axillary adenopathy. Lungs/Pleura: Minimal atelectasis. No consolidation, features of edema, pneumothorax, or effusion. No suspicious pulmonary nodules or masses. Upper Abdomen: Diffuse hepatic hypoattenuation compatible with hepatic steatosis. No acute abnormalities present in the visualized portions of the upper abdomen. Musculoskeletal: Mildly age advanced  degenerative changes in the spine. No acute osseous abnormality or suspicious osseous lesion. No acute or conspicuous chest wall lesions. Review of the MIP images confirms the above findings. IMPRESSION: 1. Exam quality degraded by borderline opacification and patient body habitus. 2. Demonstrable distal right central, right upper, middle and lower lobar and smaller segmental filling defects throughout the right lung. Question artifact versus additional filling defect in segmental branches of the left lower lobe. No evidence of right heart strain. 3. Hepatic steatosis. These results were called by telephone at the time of interpretation on 09/02/2020 at 12:10 am to provider JADE SUNG , who verbally acknowledged these results. Electronically Signed   By: Kreg Shropshire M.D.   On: 09/02/2020 00:10   US Venous Img Lower Unilateral Right  Addendum Date: 09/01/2020   ADDENDUM REPORT: 09/01/2020 20:38 ADDENDUM: These results were called by telephone at the time of interpretation on 09/01/2020 at 8:38 pm to provider Shaune Pollack , who verbally acknowledged these results. Electronically Signed   By: Kreg Shropshire M.D.   On: 09/01/2020 20:38   Result Date: 09/01/2020 CLINICAL DATA:  Right calf pain for 1 week EXAM: RIGHT LOWER EXTREMITY VENOUS DOPPLER ULTRASOUND TECHNIQUE: Gray-scale sonography with compression, as well as color and duplex ultrasound, were performed to evaluate the deep venous system(s) from the level of the common femoral vein through the popliteal and proximal calf veins. COMPARISON:  None. FINDINGS: VENOUS Incompletely occlusive, noncompressible thrombus is seen in the popliteal and 1 of 2 posterior tibial veins. Occlusive, noncompressible thrombus is present in in both peroneal veins of the right lower extremity. Normal compressibility of the common femoral, and superficial femoral veins. Visualized portions of profunda femoral vein and great saphenous vein unremarkable. No other filling defects to  suggest additional DVT on grayscale or color Doppler imaging. Doppler waveforms show normal direction of venous flow, normal respiratory plasticity and response to augmentation. Limited views of the contralateral common femoral vein are unremarkable. OTHER None. Limitations: none IMPRESSION: Partially occlusive thrombus in the popliteal vein and a single posterior tibial vein as well as occlusive thrombus in both peroneal veins. Electronically Signed: By: Kreg Shropshire M.D. On: 09/01/2020 20:33    EKG: Independently reviewed. Sinus tachycardia no st- twave changes  Assessment/Plan Acute PE/DVT w/o hypoxemia -appears unprovoked -continue with heparin drip -transition to NOAC as able  -monitor on tele and continuous pulse ox overnight  -echo in am  Obesity -will need f/u with pcp /nutrition as out patient  DVT prophylaxis: heparin drip  Code Status: Full Family Communication: none at beside Disposition Plan:patient expected to be admitted less than 2 midnights Consults called: n/a Admission status:inpatient    Lurline Del MD [facility]  If 7PM-7AM, please contact night-coverage [email or web address] Password [email or web address]  09/02/2020, 1:23 AM

## 2020-09-02 NOTE — Progress Notes (Signed)
ANTICOAGULATION CONSULT NOTE - Initial Consult  Pharmacy Consult for Heparin Infusion Indication: pulmonary embolus  No Known Allergies  Patient Measurements: Height: 5\' 5"  (165.1 cm) Weight: (!) 167.8 kg (370 lb) IBW/kg (Calculated) : 57 Heparin Dosing Weight: 100.2 kg  Vital Signs: Temp: 98.3 F (36.8 C) (02/07 1929) Temp Source: Oral (02/07 1929) BP: 151/96 (02/08 0030) Pulse Rate: 99 (02/08 0045)  Labs: Recent Labs    09/01/20 2059  HGB 12.6  HCT 37.6  PLT 240  CREATININE 0.85  TROPONINIHS 3    Estimated Creatinine Clearance: 153.4 mL/min (by C-G formula based on SCr of 0.85 mg/dL).  Assessment Pt is 30 yo female c/o cough x 3 weeks, sent to ED by [facility] acute care to R/O DVT/PE  Goal of Therapy:  Heparin level 0.3-0.7 units/ml Monitor platelets by anticoagulation protocol: Yes   Plan:  Give 6000 units bolus x 1 Start heparin infusion at 1700 units/hr Check anti-Xa level in 6 hours and daily while on heparin Continue to monitor H&H and platelets  [facility], PharmD, [facility] 09/02/2020 1:14 AM

## 2020-09-02 NOTE — Progress Notes (Signed)
*  PRELIMINARY RESULTS* Echocardiogram 2D Echocardiogram has been performed.  Cristela Blue 09/02/2020, 1:07 PM

## 2020-09-02 NOTE — Progress Notes (Signed)
ANTICOAGULATION CONSULT NOTE - Initial Consult  Pharmacy Consult for Heparin Infusion Indication: pulmonary embolus  No Known Allergies  Patient Measurements: Height: 5\' 5"  (165.1 cm) Weight: (!) 167.8 kg (370 lb) IBW/kg (Calculated) : 57 Heparin Dosing Weight: 100.2 kg  Vital Signs: Temp: 98.3 F (36.8 C) (02/07 1929) Temp Source: Oral (02/07 1929) BP: 135/83 (02/08 0200) Pulse Rate: 108 (02/08 0630)  Labs: Recent Labs    09/01/20 2059 09/02/20 0035 09/02/20 0606  HGB 12.6  --  12.4  HCT 37.6  --  36.1  PLT 240  --  237  APTT  --  134*  --   LABPROT  --  14.9  --   INR  --  1.2  --   HEPARINUNFRC  --   --  0.39  CREATININE 0.85  --  0.81  TROPONINIHS 3  --   --     Estimated Creatinine Clearance: 160.9 mL/min (by C-G formula based on SCr of 0.81 mg/dL).  Assessment Pt is 30 yo female c/o cough x 3 weeks, sent to ED by [facility] acute care to R/O DVT/PE  2/8  0606 HL = 0.39, therapeutic  Goal of Therapy:  Heparin level 0.3-0.7 units/ml Monitor platelets by anticoagulation protocol: Yes   Plan:  Continue heparin infusion at 1700 units/hr Recheck HL in 6 hours to confirm then daily, Continue to monitor H&H and platelets.  4/8, PharmD, [facility] 09/02/2020 7:17 AM

## 2020-09-02 NOTE — Discharge Instructions (Signed)
Information on my medicine - ELIQUIS (apixaban)  This medication education was reviewed with me or my healthcare representative as part of my discharge preparation.    Why was Eliquis prescribed for you? Eliquis was prescribed to treat blood clots that may have been found in the veins of your legs (deep vein thrombosis) or in your lungs (pulmonary embolism) and to reduce the risk of them occurring again.  What do You need to know about Eliquis ? The starting dose is 10 mg (two 5 mg tablets) taken TWICE daily for the FIRST SEVEN (7) DAYS, then on 2/15  the dose is reduced to ONE 5 mg tablet taken TWICE daily.  Eliquis may be taken with or without food.   Try to take the dose about the same time in the morning and in the evening. If you have difficulty swallowing the tablet whole please discuss with your pharmacist how to take the medication safely.  Take Eliquis exactly as prescribed and DO NOT stop taking Eliquis without talking to the doctor who prescribed the medication.  Stopping may increase your risk of developing a new blood clot.  Refill your prescription before you run out.  After discharge, you should have regular check-up appointments with your healthcare provider that is prescribing your Eliquis.    What do you do if you miss a dose? If a dose of ELIQUIS is not taken at the scheduled time, take it as soon as possible on the same day and twice-daily administration should be resumed. The dose should not be doubled to make up for a missed dose.  Important Safety Information A possible side effect of Eliquis is bleeding. You should call your healthcare provider right away if you experience any of the following: ? Bleeding from an injury or your nose that does not stop. ? Unusual colored urine (red or dark brown) or unusual colored stools (red or black). ? Unusual bruising for unknown reasons. ? A serious fall or if you hit your head (even if there is no bleeding).  Some  medicines may interact with Eliquis and might increase your risk of bleeding or clotting while on Eliquis. To help avoid this, consult your healthcare provider or pharmacist prior to using any new prescription or non-prescription medications, including herbals, vitamins, non-steroidal anti-inflammatory drugs (NSAIDs) and supplements.  This website has more information on Eliquis (apixaban): http://[email or web address].com/eliquis/home

## 2020-09-03 LAB — GLUCOSE, CAPILLARY: Glucose-Capillary: 82 mg/dL (ref 70–99)

## 2020-09-03 MED ORDER — APIXABAN (ELIQUIS) VTE STARTER PACK (10MG AND 5MG): ORAL_TABLET | ORAL | 0 refills | Status: DC

## 2020-09-03 NOTE — Discharge Summary (Signed)
Physician Discharge Summary  Crystal Hicks HTD:[id number] DOB: [birth year] DOA: 09/01/2020  PCP: Patient, No Pcp Per  Admit date: 09/01/2020 Discharge date: 09/03/2020  Admitted From: Home Disposition:  Home  Recommendations for Outpatient Follow-up:  1. Follow up with PCP in 1-2 weeks 2. Please obtain BMP/CBC in one week 3. Please follow up on imaging as discussed when evaluated by PCP  Home Health: None  Equipment/Devices:None  Discharge Condition:Stable  CODE STATUS:FULL  Diet recommendation:  Low salt, low fat diet  Brief/Interim Summary: Briefly this is a 30 year old morbidly obese female with a BMI of 58, has been getting weekly Covid tests as she is unvaccinated to work.,  She tested positive for COVID-19 on 1/18, at the time had URI, sore throat cough congestion body aches chills who recovered well, now presenting with right calf pain and swelling, ongoing cough and congestion, in the emergency room she was diagnosed with right lower extremity DVT and bilateral pulmonary embolism.  Patient admitted as above with acute bilateral PE DVT in the setting of COVID-19 infection, poorly ambulatory status, with risk factors of obesity, sedentary lifestyle, female.  At this time given imaging will continue Eliquis starter pack at discharge, she remains symptom-free at this point this morning denies chest pain shortness of breath nausea vomiting diarrhea constipation headache fevers or chills.  Lengthy discussion about need for medication compliance and need for PCP to follow closely given her diagnosis.  Patient is stable and agreeable for discharge home.   Discharge Diagnoses:  Active Problems:   PE (pulmonary thromboembolism) (HCC) Bilateral DVT   Discharge Instructions  Discharge Instructions    Call MD for:  difficulty breathing, headache or visual disturbances   Complete by: As directed    Call MD for:  severe uncontrolled pain   Complete by: As directed    Diet - low sodium  heart healthy   Complete by: As directed    Increase activity slowly   Complete by: As directed      Allergies as of 09/03/2020   No Known Allergies     Medication List    TAKE these medications   albuterol 108 (90 Base) MCG/ACT inhaler Commonly known as: VENTOLIN HFA INHALE 2 PUFFS EVERY 6 HOURS IF NEEDED FOR WHEEZING.   Apixaban Starter Pack (10mg  and 5mg ) Commonly known as: ELIQUIS STARTER PACK Take as directed on package: start with two-5mg  tablets twice daily for 7 days. On day 8, switch to one-5mg  tablet twice daily.   benzonatate 100 MG capsule Commonly known as: TESSALON Take 1 capsule (100 mg total) by mouth every 8 (eight) hours.   naproxen 500 MG tablet Commonly known as: Naprosyn Take 1 tablet (500 mg total) by mouth 2 (two) times daily with a meal.   promethazine-dextromethorphan 6.25-15 MG/5ML syrup Commonly known as: PROMETHAZINE-DM Take 5 mLs by mouth 4 (four) times daily as needed for cough.       No Known Allergies  Consultations:  None   Procedures/Studies: DG Chest 2 View  Result Date: 08/19/2020 CLINICAL DATA:  Cough, fever, tachycardia, tachypnea, COVID-19 exposure EXAM: CHEST - 2 VIEW COMPARISON:  None. FINDINGS: Frontal and lateral views of the chest demonstrate an unremarkable cardiac silhouette. Lung volumes are diminished with crowding of the central vasculature. No airspace disease, effusion, or pneumothorax. No acute bony abnormalities. IMPRESSION: 1. Low lung volumes.  No acute airspace disease. Electronically Signed   By: Sharlet Salina M.D.   On: 08/19/2020 15:34   CT Angio Chest PE W/Cm &/  Or Wo Cm  Result Date: 09/02/2020 CLINICAL DATA:  PE suspected, high probability, positive DVT, chest pain and cough for 3 weeks EXAM: CT ANGIOGRAPHY CHEST WITH CONTRAST TECHNIQUE: Multidetector CT imaging of the chest was performed using the standard protocol during bolus administration of intravenous contrast. Multiplanar CT image reconstructions and  MIPs were obtained to evaluate the vascular anatomy. CONTRAST:  100mL OMNIPAQUE IOHEXOL 350 MG/ML SOLN COMPARISON:  Radiograph 08/19/2020 FINDINGS: Cardiovascular: Initial contrast opacification of the pulmonary arteries with suboptimal requiring repeat injection. Repeat contrast bolus opacification is borderline with imaging quality significantly degraded by patient body habitus. However, there are demonstrable distal right central, right upper, middle and lower lobar and smaller segmental filling defects throughout the right lung (18/71, 67, 78). Question artifact versus additional filling defect in segmental branches of the left lower lobe (18/72). Central pulmonary arteries are normal caliber. RV/LV ratio is maintained at 0.55. Normal heart size. No pericardial effusion. No pericardial effusion. Mediastinum/Nodes: Some soft tissue attenuation in the anterior mediastinum is favored to reflect remnant in the absence trauma. No mediastinal fluid or gas. Normal thyroid gland and thoracic inlet. No acute abnormality of the trachea or esophagus. No worrisome mediastinal, hilar or axillary adenopathy. Lungs/Pleura: Minimal atelectasis. No consolidation, features of edema, pneumothorax, or effusion. No suspicious pulmonary nodules or masses. Upper Abdomen: Diffuse hepatic hypoattenuation compatible with hepatic steatosis. No acute abnormalities present in the visualized portions of the upper abdomen. Musculoskeletal: Mildly age advanced degenerative changes in the spine. No acute osseous abnormality or suspicious osseous lesion. No acute or conspicuous chest wall lesions. Review of the MIP images confirms the above findings. IMPRESSION: 1. Exam quality degraded by borderline opacification and patient body habitus. 2. Demonstrable distal right central, right upper, middle and lower lobar and smaller segmental filling defects throughout the right lung. Question artifact versus additional filling defect in segmental  branches of the left lower lobe. No evidence of right heart strain. 3. Hepatic steatosis. These results were called by telephone at the time of interpretation on 09/02/2020 at 12:10 am to provider JADE SUNG , who verbally acknowledged these results. Electronically Signed   By: Kreg ShropshirePrice  DeHay M.D.   On: 09/02/2020 00:10   US Venous Img Lower Unilateral Right  Addendum Date: 09/01/2020   ADDENDUM REPORT: 09/01/2020 20:38 ADDENDUM: These results were called by telephone at the time of interpretation on 09/01/2020 at 8:38 pm to provider Shaune PollackAMERON ISAACS , who verbally acknowledged these results. Electronically Signed   By: Kreg ShropshirePrice  DeHay M.D.   On: 09/01/2020 20:38   Result Date: 09/01/2020 CLINICAL DATA:  Right calf pain for 1 week EXAM: RIGHT LOWER EXTREMITY VENOUS DOPPLER ULTRASOUND TECHNIQUE: Gray-scale sonography with compression, as well as color and duplex ultrasound, were performed to evaluate the deep venous system(s) from the level of the common femoral vein through the popliteal and proximal calf veins. COMPARISON:  None. FINDINGS: VENOUS Incompletely occlusive, noncompressible thrombus is seen in the popliteal and 1 of 2 posterior tibial veins. Occlusive, noncompressible thrombus is present in in both peroneal veins of the right lower extremity. Normal compressibility of the common femoral, and superficial femoral veins. Visualized portions of profunda femoral vein and great saphenous vein unremarkable. No other filling defects to suggest additional DVT on grayscale or color Doppler imaging. Doppler waveforms show normal direction of venous flow, normal respiratory plasticity and response to augmentation. Limited views of the contralateral common femoral vein are unremarkable. OTHER None. Limitations: none IMPRESSION: Partially occlusive thrombus in the popliteal vein and a  single posterior tibial vein as well as occlusive thrombus in both peroneal veins. Electronically Signed: By: Kreg Shropshire M.D. On: 09/01/2020  20:33   ECHOCARDIOGRAM COMPLETE  Result Date: 09/02/2020    ECHOCARDIOGRAM REPORT   Patient Name:   JANETT KAMATH Date of Exam: 09/02/2020 Medical Rec #:  [id number]          Height:       65.0 in Accession #:    [id number]         Weight:       370.0 lb Date of Birth:  [birth year]          BSA:          2.569 m Patient Age:    29 years           BP:           146/107 mmHg Patient Gender: F                  HR:           91 bpm. Exam Location:  [facility] Procedure: 2D Echo, Color Doppler and Cardiac Doppler Indications:     Pulmonary embolus 415.19 / I26.99  History:         Patient has no prior history of Echocardiogram examinations. No                  medical history on file.  Sonographer:     Cristela Blue RDCS (AE) Referring Phys:  [id number] Beulah Gandy A THOMAS Diagnosing Phys: Cristal Deer End MD  Sonographer Comments: Technically challenging study due to limited acoustic windows, no apical window and no subcostal window. IMPRESSIONS  1. Left ventricular ejection fraction, by estimation, is 60 to 65%. The left ventricle has normal function. Left ventricular endocardial border not optimally defined to evaluate regional wall motion. There is mild left ventricular hypertrophy. Left ventricular diastolic function could not be evaluated.  2. Right ventricular systolic function was not well visualized. The right ventricular size is not well visualized. Tricuspid regurgitation signal is inadequate for assessing PA pressure.  3. The mitral valve is grossly normal. Unable to accurately assess mitral valve regurgitation.  4. The aortic valve was not well visualized. Aortic valve regurgitation not well assessed. FINDINGS  Left Ventricle: Left ventricular ejection fraction, by estimation, is 60 to 65%. The left ventricle has normal function. Left ventricular endocardial border not optimally defined to evaluate regional wall motion. The left ventricular internal cavity size was normal in size. There is mild left ventricular  hypertrophy. Left ventricular diastolic function could not be evaluated. Right Ventricle: The right ventricular size is not well visualized. Right vetricular wall thickness was not well visualized. Right ventricular systolic function was not well visualized. Tricuspid regurgitation signal is inadequate for assessing PA pressure. Left Atrium: Left atrial size was not well visualized. Right Atrium: Right atrial size was not well visualized. Pericardium: The pericardium was not well visualized. Mitral Valve: The mitral valve is grossly normal. Unable to accurately assess mitral valve regurgitation. Tricuspid Valve: The tricuspid valve is not well visualized. Tricuspid valve regurgitation is trivial. Aortic Valve: The aortic valve was not well visualized. Aortic valve regurgitation not well assessed. Pulmonic Valve: The pulmonic valve was not well visualized. Pulmonic valve regurgitation is not visualized. No evidence of pulmonic stenosis. Aorta: The aortic root and ascending aorta are structurally normal, with no evidence of dilitation. Pulmonary Artery: The pulmonary artery is not well seen. Venous: The inferior vena cava was  not well visualized. IAS/Shunts: The interatrial septum was not well visualized.  LEFT VENTRICLE PLAX 2D LVIDd:         3.76 cm LVIDs:         2.26 cm LV PW:         1.25 cm LV IVS:        1.24 cm LVOT diam:     2.10 cm LVOT Area:     3.46 cm  LEFT ATRIUM         Index LA diam:    3.20 cm 1.25 cm/m                        PULMONIC VALVE AORTA                 PV Vmax:        0.73 m/s Ao Root diam: 2.95 cm PV Peak grad:   2.1 mmHg                       RVOT Peak grad: 3 mmHg   SHUNTS Systemic Diam: 2.10 cm Yvonne Kendall MD Electronically signed by Yvonne Kendall MD Signature Date/Time: 09/02/2020/5:52:47 PM    Final      Subjective: No acute issues or events overnight denies nausea vomiting diarrhea constipation headache fevers or chills.   Discharge Exam: Vitals:   09/03/20 0016  09/03/20 0542  BP: (!) 141/87 106/66  Pulse: 85 91  Resp: 20 18  Temp: 98 F (36.7 C) 98.1 F (36.7 C)  SpO2: 98% 98%   Vitals:   09/02/20 1623 09/02/20 2145 09/03/20 0016 09/03/20 0542  BP: (!) 146/80 (!) 135/91 (!) 141/87 106/66  Pulse: 96 83 85 91  Resp: 18 20 20 18   Temp: 98.2 F (36.8 C) 98.2 F (36.8 C) 98 F (36.7 C) 98.1 F (36.7 C)  TempSrc: Oral Oral  Oral  SpO2: 99% 98% 98% 98%  Weight:    (!) 167.8 kg  Height:        General: Pt is alert, awake, not in acute distress Cardiovascular: RRR, S1/S2 +, no rubs, no gallops Respiratory: CTA bilaterally, no wheezing, no rhonchi Abdominal: Soft, obese, NT, ND, bowel sounds + Extremities: no edema, no cyanosis    The results of significant diagnostics from this hospitalization (including imaging, microbiology, ancillary and laboratory) are listed below for reference.     Microbiology: Recent Results (from the past 240 hour(s))  SARS Coronavirus 2 by RT PCR (hospital order, performed in [facility] hospital lab) Nasopharyngeal Nasopharyngeal Swab     Status: None   Collection Time: 09/02/20  6:10 AM   Specimen: Nasopharyngeal Swab  Result Value Ref Range Status   SARS Coronavirus 2 NEGATIVE NEGATIVE Final    Comment: (NOTE) SARS-CoV-2 target nucleic acids are NOT DETECTED.  The SARS-CoV-2 RNA is generally detectable in upper and lower respiratory specimens during the acute phase of infection. The lowest concentration of SARS-CoV-2 viral copies this assay can detect is 250 copies / mL. A negative result does not preclude SARS-CoV-2 infection and should not be used as the sole basis for treatment or other patient management decisions.  A negative result may occur with improper specimen collection / handling, submission of specimen other than nasopharyngeal swab, presence of viral mutation(s) within the areas targeted by this assay, and inadequate number of viral copies (<250 copies / mL). A negative result must be  combined with clinical observations, patient history,  and epidemiological information.  Fact Sheet for Patients:   BoilerBrush.com.cy  Fact Sheet for Healthcare Providers: https://pope.com/  This test is not yet approved or  cleared by the [place] FDA and has been authorized for detection and/or diagnosis of SARS-CoV-2 by FDA under an Emergency Use Authorization (EUA).  This EUA will remain in effect (meaning this test can be used) for the duration of the COVID-19 declaration under Section 564(b)(1) of the Act, 21 U.S.C. section 360bbb-3(b)(1), unless the authorization is terminated or revoked sooner.  Performed at [facility], [address] [place], [place] [address]      Labs: BNP (last 3 results) Recent Labs    09/02/20 0220  BNP 8.0   Basic Metabolic Panel: Recent Labs  Lab 09/01/20 2059 09/02/20 0606  NA 139 139  K 3.9 3.7  CL 107 105  CO2 25 25  GLUCOSE 95 115*  BUN 15 12  CREATININE 0.85 0.81  CALCIUM 9.1 8.8*   Liver Function Tests: No results for input(s): AST, ALT, ALKPHOS, BILITOT, PROT, ALBUMIN in the last 168 hours. No results for input(s): LIPASE, AMYLASE in the last 168 hours. No results for input(s): AMMONIA in the last 168 hours. CBC: Recent Labs  Lab 09/01/20 2059 09/02/20 0606  WBC 9.9 9.7  NEUTROABS 7.4  --   HGB 12.6 12.4  HCT 37.6 36.1  MCV 85.1 85.1  PLT 240 237   Cardiac Enzymes: No results for input(s): CKTOTAL, CKMB, CKMBINDEX, TROPONINI in the last 168 hours. BNP: Invalid input(s): POCBNP CBG: Recent Labs  Lab 09/02/20 0939 09/03/20 0744  GLUCAP 123* 82   D-Dimer No results for input(s): DDIMER in the last 72 hours. Hgb A1c Recent Labs    09/02/20 0220  HGBA1C 5.7*   Lipid Profile No results for input(s): CHOL, HDL, LDLCALC, TRIG, CHOLHDL, LDLDIRECT in the last 72 hours. Thyroid function studies Recent Labs    09/02/20 0220  TSH 4.865*    Anemia work up No results for input(s): VITAMINB12, FOLATE, FERRITIN, TIBC, IRON, RETICCTPCT in the last 72 hours. Urinalysis No results found for: COLORURINE, APPEARANCEUR, LABSPEC, PHURINE, GLUCOSEU, HGBUR, BILIRUBINUR, KETONESUR, PROTEINUR, UROBILINOGEN, NITRITE, LEUKOCYTESUR Sepsis Labs Invalid input(s): PROCALCITONIN,  WBC,  LACTICIDVEN Microbiology Recent Results (from the past 240 hour(s))  SARS Coronavirus 2 by RT PCR (hospital order, performed in [facility] hospital lab) Nasopharyngeal Nasopharyngeal Swab     Status: None   Collection Time: 09/02/20  6:10 AM   Specimen: Nasopharyngeal Swab  Result Value Ref Range Status   SARS Coronavirus 2 NEGATIVE NEGATIVE Final    Comment: (NOTE) SARS-CoV-2 target nucleic acids are NOT DETECTED.  The SARS-CoV-2 RNA is generally detectable in upper and lower respiratory specimens during the acute phase of infection. The lowest concentration of SARS-CoV-2 viral copies this assay can detect is 250 copies / mL. A negative result does not preclude SARS-CoV-2 infection and should not be used as the sole basis for treatment or other patient management decisions.  A negative result may occur with improper specimen collection / handling, submission of specimen other than nasopharyngeal swab, presence of viral mutation(s) within the areas targeted by this assay, and inadequate number of viral copies (<250 copies / mL). A negative result must be combined with clinical observations, patient history, and epidemiological information.  Fact Sheet for Patients:   BoilerBrush.com.cy  Fact Sheet for Healthcare Providers: https://pope.com/  This test is not yet approved or  cleared by the [place] FDA and has been authorized for  detection and/or diagnosis of SARS-CoV-2 by FDA under an Emergency Use Authorization (EUA).  This EUA will remain in effect (meaning this test can be used) for the  duration of the COVID-19 declaration under Section 564(b)(1) of the Act, 21 U.S.C. section 360bbb-3(b)(1), unless the authorization is terminated or revoked sooner.  Performed at [facility], [address] [place], [place] [address]      Time coordinating discharge: Over 30 minutes  SIGNED:   Azucena Fallen, DO [facility] 09/03/2020, 10:26 AM Pager   If 7PM-7AM, please contact night-coverage [email or web address]

## 2020-09-03 NOTE — TOC Initial Note (Signed)
Transition of Care [facility]) - Initial/Assessment Note    Patient Details  Name: Crystal Hicks MRN: [id number] Date of Birth: 12-Nov-1990  [facility] CM/SW Contact:    Chapman Fitch, RN Phone Number: 09/03/2020, 12:09 PM  Clinical Narrative:                  Patient admitted from home with PE Patient lives at home alone   Started on eliquis at discharge. To be filled for free at [facility]  Patient to pick up at discharge.  Provided application to [facility]  In order to continue getting her medications  Follow up appointment made at [facility] on 3/8  Added to AVS and notified patient.   Also provided patient with eliquis 30 coupon   Expected Discharge Plan: Home/Self Care Barriers to Discharge: No Barriers Identified   Patient Goals and CMS Choice        Expected Discharge Plan and Services Expected Discharge Plan: Home/Self Care   Discharge Planning Services: [facility],Follow-up appt scheduled     Expected Discharge Date: 09/03/20                                    Prior Living Arrangements/Services                       Activities of Daily Living Home Assistive Devices/Equipment: None ADL Screening (condition at time of admission) Patient's cognitive ability adequate to safely complete daily activities?: Yes Is the patient deaf or have difficulty hearing?: No Does the patient have difficulty seeing, even when wearing glasses/contacts?: No Does the patient have difficulty concentrating, remembering, or making decisions?: No Patient able to express need for assistance with ADLs?: Yes Does the patient have difficulty dressing or bathing?: No Independently performs ADLs?: Yes (appropriate for developmental age) Does the patient have difficulty walking or climbing stairs?: No Weakness of Legs: None Weakness of Arms/Hands: None  Permission Sought/Granted                   Emotional Assessment              Admission diagnosis:  PE (pulmonary thromboembolism) (HCC) [I26.99] Acute deep vein thrombosis (DVT) of proximal vein of right lower extremity (HCC) [I82.4Y1] Chest pain, unspecified type [R07.9] Other acute pulmonary embolism, unspecified whether acute cor pulmonale present (HCC) [I26.99] Patient Active Problem List   Diagnosis Date Noted  . PE (pulmonary thromboembolism) (HCC) 09/02/2020   PCP:  Patient, No Pcp Per Pharmacy:   [facility] [place], [place] - [address] [place] [address] Phone: [phone] Fax: [phone]  [facility] [place], [place] - [address] [place] [address] Phone: [phone] Fax: [phone]     Social Determinants of Health (SDOH) Interventions    Readmission Risk Interventions No flowsheet data found.

## 2020-09-09 ENCOUNTER — Other Ambulatory Visit: Payer: Self-pay

## 2020-09-09 ENCOUNTER — Ambulatory Visit: Payer: Self-pay | Admitting: Pharmacy Technician

## 2020-09-09 DIAGNOSIS — Z79899 Other long term (current) drug therapy: Secondary | ICD-10-CM

## 2020-09-09 NOTE — Progress Notes (Signed)
Completed [facility] application and contract.  Patient agreed to all terms of the [facility] contract.    Patient approved to receive medication assistance at [facility] until time for re-certification in 2023, and as long as eligibility criteria continues to be met.    Provided patient with community resource material based on her particular needs.    Matti Killingsworth J. Ruthell Feigenbaum Care Manager [facility]  

## 2020-09-30 ENCOUNTER — Other Ambulatory Visit: Payer: Self-pay | Admitting: Family Medicine

## 2020-10-28 ENCOUNTER — Other Ambulatory Visit: Payer: Self-pay

## 2020-10-28 MED ORDER — ELIQUIS 5 MG PO TABS
ORAL_TABLET | Freq: Two times a day (BID) | ORAL | 0 refills | Status: DC
  Filled 2020-10-28: qty 60, 30d supply, fill #0

## 2020-10-29 ENCOUNTER — Other Ambulatory Visit: Payer: Self-pay

## 2020-10-30 ENCOUNTER — Other Ambulatory Visit: Payer: Self-pay

## 2020-12-12 ENCOUNTER — Other Ambulatory Visit: Payer: Self-pay

## 2020-12-13 MED ORDER — ELIQUIS 5 MG PO TABS
ORAL_TABLET | Freq: Two times a day (BID) | ORAL | 0 refills | Status: DC
  Filled 2020-12-13: qty 60, 30d supply, fill #0

## 2020-12-15 ENCOUNTER — Other Ambulatory Visit: Payer: Self-pay

## 2020-12-15 MED ORDER — ELIQUIS 5 MG PO TABS
1.0000 | ORAL_TABLET | Freq: Two times a day (BID) | ORAL | 3 refills | Status: DC
  Filled 2021-01-30: qty 60, 30d supply, fill #0

## 2020-12-15 MED ORDER — GABAPENTIN 100 MG PO CAPS
ORAL_CAPSULE | ORAL | 2 refills | Status: DC
  Filled 2020-12-15: qty 90, 15d supply, fill #0
  Filled 2021-01-30: qty 90, 15d supply, fill #1

## 2020-12-17 ENCOUNTER — Other Ambulatory Visit: Payer: Self-pay

## 2021-01-30 ENCOUNTER — Other Ambulatory Visit: Payer: Self-pay

## 2021-02-13 ENCOUNTER — Other Ambulatory Visit: Payer: Self-pay

## 2021-04-15 ENCOUNTER — Other Ambulatory Visit: Payer: Self-pay

## 2021-04-15 MED ORDER — AMLODIPINE BESYLATE 10 MG PO TABS
ORAL_TABLET | Freq: Every day | ORAL | 1 refills | Status: DC
  Filled 2021-04-15: qty 30, 30d supply, fill #0

## 2021-04-15 MED ORDER — GABAPENTIN 100 MG PO CAPS
ORAL_CAPSULE | ORAL | 2 refills | Status: DC
Start: 2021-04-14 — End: 2022-12-03
  Filled 2021-04-15: qty 90, 30d supply, fill #0

## 2021-07-09 ENCOUNTER — Telehealth: Payer: Self-pay | Admitting: Pharmacy Technician

## 2021-07-09 NOTE — Telephone Encounter (Signed)
Patient failed to provide 2022 proof of income.  No additional medication assistance will be provided by [facility] without the required proof of income documentation.  Patient notified by letter.  Sherilyn Dacosta Care Manager [facility]       [address] [place], [place]  [address]   July 09, 2021    Crystal Hicks [address] [place], [place]  [address]  Dear Crystal Hicks:  This is to inform you that you are no longer eligible to receive medication assistance at [facility].  The reason(s) are:    _____Your total gross monthly household income exceeds 250% of the Federal Poverty Level.   _____Tangible assets (savings, checking, stocks/bonds, pension, retirement, etc.) exceeds our             limit  _____You are eligible to receive benefits from [facility], [facility] or HIV Medication              Assistance Program _____You are eligible to receive benefits from a Medicare Part D plan _____You have prescription insurance  _____You are not an [facility] resident __X__Failure to provide all requested documentation.  Still need to provide 2021 Federal Tax Return.  Medication assistance will resume once all requested documentation has been returned to our clinic.  If you have questions, please contact our clinic at [phone].    Thank you,  [facility]

## 2021-07-14 ENCOUNTER — Other Ambulatory Visit: Payer: Self-pay

## 2021-11-27 ENCOUNTER — Other Ambulatory Visit: Payer: Self-pay

## 2021-12-04 ENCOUNTER — Ambulatory Visit: Payer: Self-pay

## 2022-02-04 IMAGING — CT CT ANGIO CHEST
2 of 12 series · 11 of 46 positions shown · IV contrast (APPLIED)
Comparison: Radiograph 08/19/2020

CLINICAL DATA: PE suspected, high probability, positive DVT, chest
pain and cough for 3 weeks

EXAM:
CT ANGIOGRAPHY CHEST WITH CONTRAST
TECHNIQUE: Multidetector CT imaging of the chest was performed using the
standard protocol during bolus administration of intravenous
contrast. Multiplanar CT image reconstructions and MIPs were
obtained to evaluate the vascular anatomy.
CONTRAST:  100mL OMNIPAQUE IOHEXOL 350 MG/ML SOLN

[Series 5: thins · axial · 0.82mm/px · z∈[-476,-238]mm · 10 of 292 slices shown]
[im 27/292  lung]
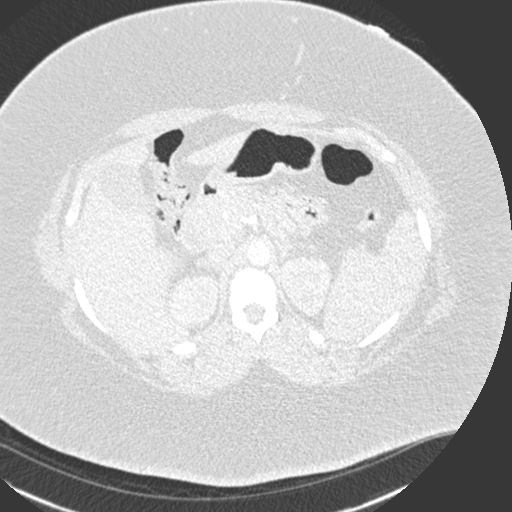
[im 53/292  soft-tissue]
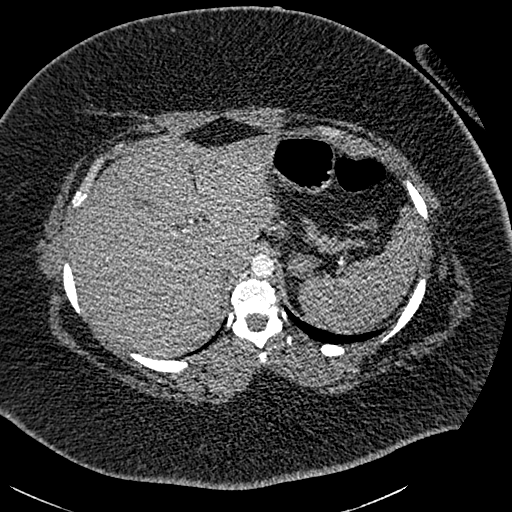
[im 80/292  lung]
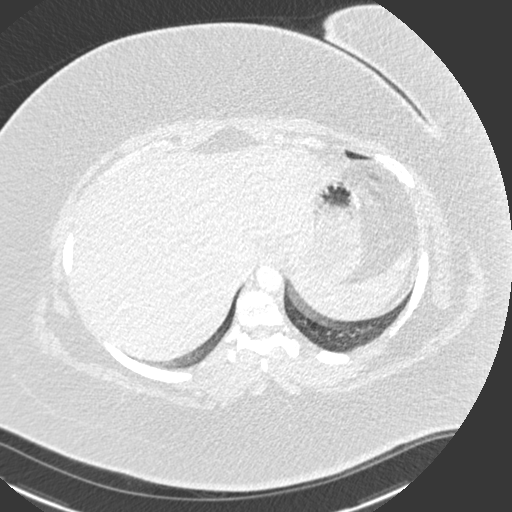
[im 106/292  soft-tissue]
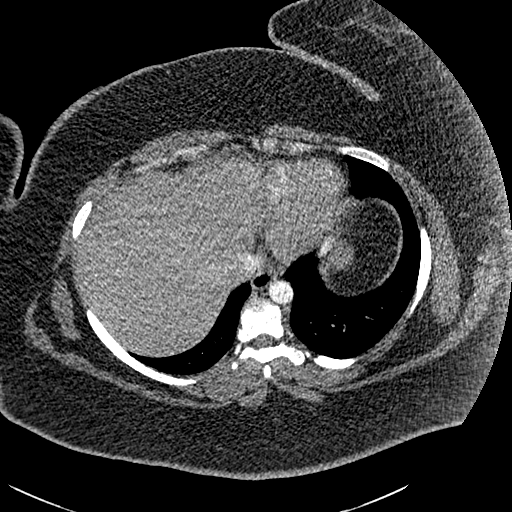
[im 133/292  lung]
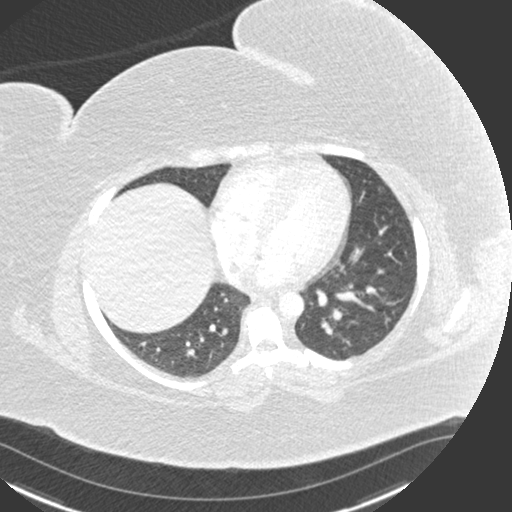
[im 159/292  soft-tissue]
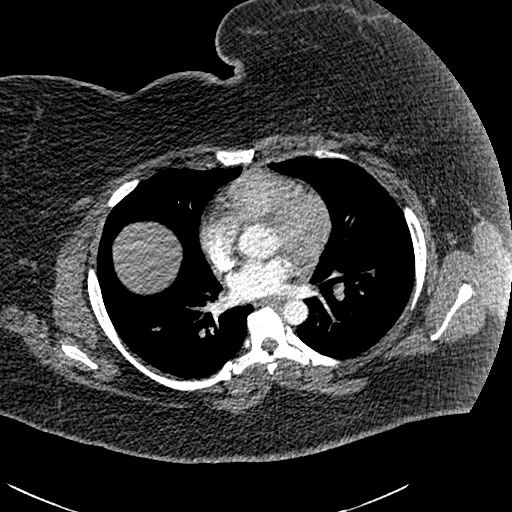
[im 186/292  lung]
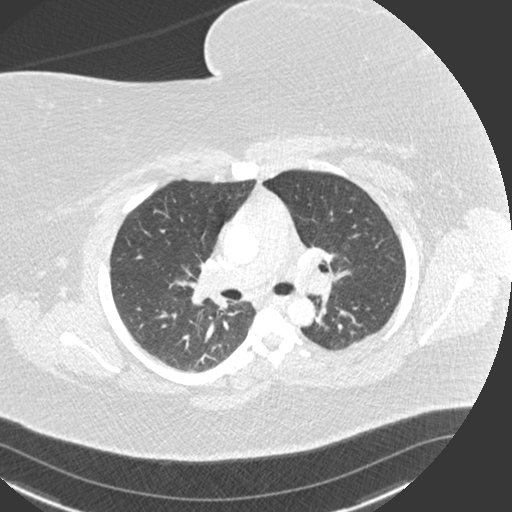
[im 212/292  soft-tissue]
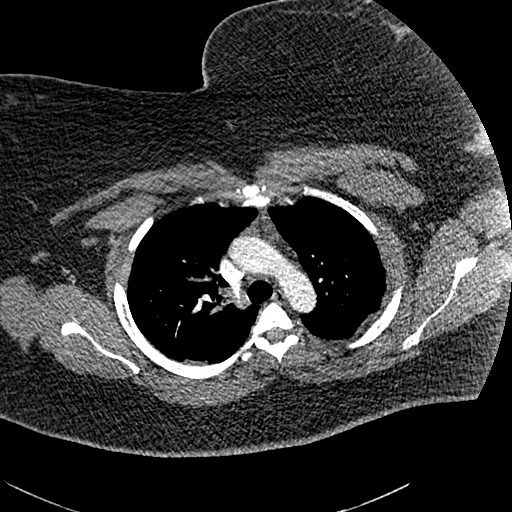
[im 239/292  lung]
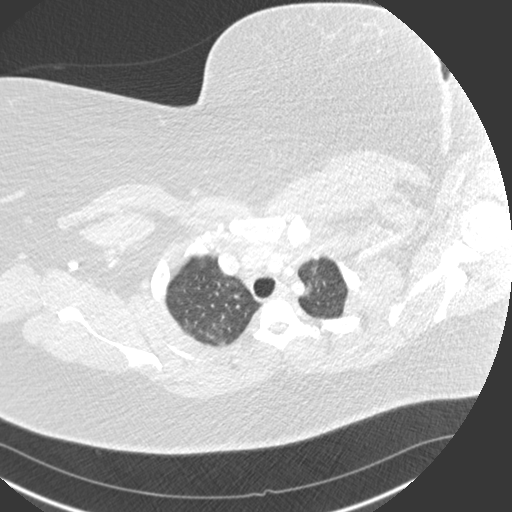
[im 265/292  soft-tissue]
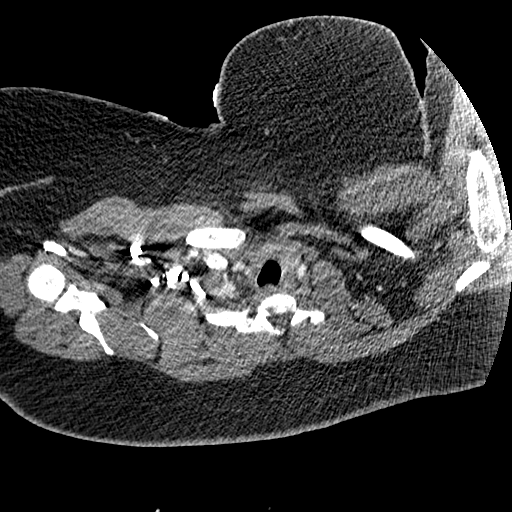

[Series 7: coronal mpr · coronal · 0.57mm/px · 1 of 106 slices shown]
[im 53/106  soft-tissue]
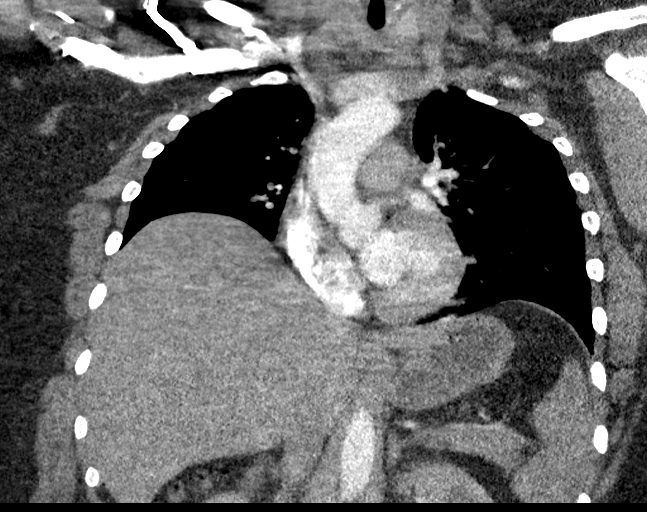

[11 of 46 positions shown; findings below may reference images not displayed]

FINDINGS: Cardiovascular: Initial contrast opacification of the pulmonary
arteries with suboptimal requiring repeat injection. Repeat contrast
bolus opacification is borderline with imaging quality significantly
degraded by patient body habitus. However, there are demonstrable
distal right central, right upper, middle and lower lobar and
smaller segmental filling defects throughout the right lung (18/71,
67, 78). Question artifact versus additional filling defect in
segmental branches of the left lower lobe (18/72). Central pulmonary
arteries are normal caliber. RV/LV ratio is maintained at 0.55.
Normal heart size. No pericardial effusion. No pericardial effusion.

Mediastinum/Nodes: Some soft tissue attenuation in the anterior
mediastinum is favored to reflect remnant in the absence trauma. No
mediastinal fluid or gas. Normal thyroid gland and thoracic inlet.
No acute abnormality of the trachea or esophagus. No worrisome
mediastinal, hilar or axillary adenopathy.

Lungs/Pleura: Minimal atelectasis. No consolidation, features of
edema, pneumothorax, or effusion. No suspicious pulmonary nodules or
masses.

Upper Abdomen: Diffuse hepatic hypoattenuation compatible with
hepatic steatosis. No acute abnormalities present in the visualized
portions of the upper abdomen.

Musculoskeletal: Mildly age advanced degenerative changes in the
spine. No acute osseous abnormality or suspicious osseous lesion. No
acute or conspicuous chest wall lesions.

Review of the MIP images confirms the above findings.
IMPRESSION: 1. Exam quality degraded by borderline opacification and patient
body habitus.
2. Demonstrable distal right central, right upper, middle and lower
lobar and smaller segmental filling defects throughout the right
lung. Question artifact versus additional filling defect in
segmental branches of the left lower lobe. No evidence of right
heart strain.
3. Hepatic steatosis.

These results were called by telephone at the time of interpretation
acknowledged these results.

## 2022-10-25 DEATH — deceased
